# Patient Record
Sex: Female | Born: 1995 | Race: White | Hispanic: No | Marital: Married | Smoking: Former smoker
Health system: Southern US, Community
[De-identification: ages and names within clinical notes are randomized; demographics above are authoritative.]

## PROBLEM LIST (undated history)

## (undated) DIAGNOSIS — I493 Ventricular premature depolarization: Secondary | ICD-10-CM

## (undated) DIAGNOSIS — F329 Major depressive disorder, single episode, unspecified: Secondary | ICD-10-CM

## (undated) DIAGNOSIS — F32A Depression, unspecified: Secondary | ICD-10-CM

## (undated) DIAGNOSIS — F419 Anxiety disorder, unspecified: Secondary | ICD-10-CM

## (undated) DIAGNOSIS — Z3A29 29 weeks gestation of pregnancy: Secondary | ICD-10-CM

## (undated) HISTORY — DX: 29 weeks gestation of pregnancy: Z3A.29

## (undated) HISTORY — PX: WISDOM TOOTH EXTRACTION: SHX21

## (undated) HISTORY — DX: Ventricular premature depolarization: I49.3

---
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
## 2016-01-03 ENCOUNTER — Emergency Department: Payer: Managed Care, Other (non HMO)

## 2016-01-03 ENCOUNTER — Emergency Department
Admission: EM | Admit: 2016-01-03 | Discharge: 2016-01-04 | Disposition: A | Discharge status: Home / Self Care | Payer: Managed Care, Other (non HMO) | Attending: Emergency Medicine | Admitting: Emergency Medicine

## 2016-01-03 ENCOUNTER — Encounter: Payer: Self-pay | Admitting: Emergency Medicine

## 2016-01-03 DIAGNOSIS — F17200 Nicotine dependence, unspecified, uncomplicated: Secondary | ICD-10-CM | POA: Insufficient documentation

## 2016-01-03 DIAGNOSIS — E039 Hypothyroidism, unspecified: Secondary | ICD-10-CM

## 2016-01-03 DIAGNOSIS — E02 Subclinical iodine-deficiency hypothyroidism: Secondary | ICD-10-CM | POA: Diagnosis not present

## 2016-01-03 DIAGNOSIS — Z79899 Other long term (current) drug therapy: Secondary | ICD-10-CM | POA: Diagnosis not present

## 2016-01-03 DIAGNOSIS — R079 Chest pain, unspecified: Secondary | ICD-10-CM | POA: Insufficient documentation

## 2016-01-03 DIAGNOSIS — E038 Other specified hypothyroidism: Secondary | ICD-10-CM

## 2016-01-03 LAB — URINALYSIS, COMPLETE (UACMP) WITH MICROSCOPIC
BACTERIA UA: NONE SEEN
BILIRUBIN URINE: NEGATIVE
Glucose, UA: NEGATIVE mg/dL
HGB URINE DIPSTICK: NEGATIVE
KETONES UR: NEGATIVE mg/dL
LEUKOCYTES UA: NEGATIVE
NITRITE: NEGATIVE
PH: 6 (ref 5.0–8.0)
Protein, ur: NEGATIVE mg/dL
SPECIFIC GRAVITY, URINE: 1.004 — AB (ref 1.005–1.030)

## 2016-01-03 LAB — BASIC METABOLIC PANEL
ANION GAP: 9 (ref 5–15)
BUN: 10 mg/dL (ref 6–20)
CHLORIDE: 102 mmol/L (ref 101–111)
CO2: 27 mmol/L (ref 22–32)
Calcium: 9.3 mg/dL (ref 8.9–10.3)
Creatinine, Ser: 0.64 mg/dL (ref 0.44–1.00)
Glucose, Bld: 165 mg/dL — ABNORMAL HIGH (ref 65–99)
POTASSIUM: 3.3 mmol/L — AB (ref 3.5–5.1)
SODIUM: 138 mmol/L (ref 135–145)

## 2016-01-03 LAB — CBC
HEMATOCRIT: 43.2 % (ref 35.0–47.0)
Hemoglobin: 14.9 g/dL (ref 12.0–16.0)
MCH: 28.4 pg (ref 26.0–34.0)
MCHC: 34.5 g/dL (ref 32.0–36.0)
MCV: 82.3 fL (ref 80.0–100.0)
Platelets: 318 10*3/uL (ref 150–440)
RBC: 5.24 MIL/uL — AB (ref 3.80–5.20)
RDW: 13.8 % (ref 11.5–14.5)
WBC: 11 10*3/uL (ref 3.6–11.0)

## 2016-01-03 LAB — POCT PREGNANCY, URINE: PREG TEST UR: NEGATIVE

## 2016-01-03 LAB — TROPONIN I

## 2016-01-03 LAB — GLUCOSE, CAPILLARY: GLUCOSE-CAPILLARY: 156 mg/dL — AB (ref 65–99)

## 2016-01-03 NOTE — ED Notes (Signed)
Pt. States intermittent chest pain for the past week.  Pt. States weakness and dizziness are accompanied symptoms.

## 2016-01-03 NOTE — ED Triage Notes (Signed)
Pt ambulatory to triage with steady gait, no distress noted. Pt c/o dull chest pain since Thursday accompanied with lightheaded and dizziness. Pt reports she will have spells where the chest pain increases and her chest feels tight, this is when she experiences the lightheaded and dizziness. Pt is tachycardic at 126bpm at time of triage.

## 2016-01-04 ENCOUNTER — Emergency Department: Payer: Managed Care, Other (non HMO)

## 2016-01-04 ENCOUNTER — Ambulatory Visit (INDEPENDENT_AMBULATORY_CARE_PROVIDER_SITE_OTHER): Payer: Managed Care, Other (non HMO) | Admitting: Cardiology

## 2016-01-04 ENCOUNTER — Encounter: Payer: Self-pay | Admitting: Radiology

## 2016-01-04 ENCOUNTER — Encounter: Payer: Self-pay | Admitting: Cardiology

## 2016-01-04 VITALS — BP 116/64 | HR 72 | Ht 67.0 in | Wt 219.2 lb

## 2016-01-04 DIAGNOSIS — E784 Other hyperlipidemia: Secondary | ICD-10-CM | POA: Diagnosis not present

## 2016-01-04 DIAGNOSIS — R079 Chest pain, unspecified: Secondary | ICD-10-CM | POA: Diagnosis not present

## 2016-01-04 DIAGNOSIS — E7849 Other hyperlipidemia: Secondary | ICD-10-CM

## 2016-01-04 LAB — T4, FREE: FREE T4: 0.7 ng/dL (ref 0.61–1.12)

## 2016-01-04 LAB — FIBRIN DERIVATIVES D-DIMER (ARMC ONLY): Fibrin derivatives D-dimer (ARMC): 522 — ABNORMAL HIGH (ref 0–499)

## 2016-01-04 LAB — TSH: TSH: 7.141 u[IU]/mL — AB (ref 0.350–4.500)

## 2016-01-04 MED ORDER — IOPAMIDOL (ISOVUE-370) INJECTION 76%
100.0000 mL | Freq: Once | INTRAVENOUS | Status: AC | PRN
  Administered 2016-01-04: 100 mL via INTRAVENOUS

## 2016-01-04 MED ORDER — RANITIDINE HCL 150 MG PO CAPS: 150.0000 mg | ORAL_CAPSULE | Freq: Two times a day (BID) | ORAL | 0 refills | Status: DC

## 2016-01-04 NOTE — ED Provider Notes (Signed)
[facility] Emergency Department Provider Note  ____________________________________________  Time seen: Approximately 3:41 AM  I have reviewed the triage vital signs and the nursing notes.   HISTORY  Chief Complaint Chest Pain and Dizziness    HPI Genia Haroldshley N Solis is a 20 y.o. female who complains of dull chest pain for the past week, constant but waxing and waning, sometimes almost completely resolves. Sometimes associated with lightheadedness. Not exertional, not pleuritic. No shortness of breath. No cough runny nose sore throat. No vomiting diaphoresis. No syncope.  Symptoms seem to start after starting fish oil for hyperlipidemia from primary care. She feels like she's been eating and drinking normally although may be less fluids recently. Denies any change in weight.     History reviewed. No pertinent past medical history.   There are no active problems to display for this patient.    History reviewed. No pertinent surgical history.   Prior to Admission medications   Medication Sig Start Date End Date Taking? Authorizing Provider  hydrocortisone 2.5 % cream Apply 1 application topically 3 (three) times daily. For ten days 12/26/15  Yes Historical Provider, MD  Multiple Vitamin (MULTIVITAMIN WITH MINERALS) TABS tablet Take 1 tablet by mouth daily.   Yes Historical Provider, MD  ranitidine (ZANTAC) 150 MG capsule Take 1 capsule (150 mg total) by mouth 2 (two) times daily. 01/04/16   Sharman CheekPhillip Montasia Chisenhall, MD     Allergies Penicillins   History reviewed. No pertinent family history.  Social History Social History  Substance Use Topics  . Smoking status: Current Some Day Smoker  . Smokeless tobacco: Never Used  . Alcohol use No    Review of Systems  Constitutional:   No fever or chills.  ENT:   No sore throat. No rhinorrhea. Cardiovascular:   Positive as above chest pain. Respiratory:   No dyspnea or cough. Gastrointestinal:   Negative  for abdominal pain, vomiting and diarrhea.  Genitourinary:   Negative for dysuria or difficulty urinating. Musculoskeletal:   Negative for focal pain or swelling Neurological:   Negative for headaches 10-point ROS otherwise negative.  ____________________________________________   PHYSICAL EXAM:  VITAL SIGNS: ED Triage Vitals [01/03/16 2208]  Enc Vitals Group     BP (!) 157/81     Pulse Rate (!) 120     Resp 15     Temp 99.2 F (37.3 C)     Temp Source Oral     SpO2 100 %     Weight 220 lb (99.8 kg)     Height 5\' 7"  (1.702 m)     Head Circumference      Peak Flow      Pain Score      Pain Loc      Pain Edu?      Excl. in GC?     Vital signs reviewed, nursing assessments reviewed.   Constitutional:   Alert and oriented. Well appearing and in no distress. Eyes:   No scleral icterus. No conjunctival pallor. PERRL. EOMI.  No nystagmus. ENT   Head:   Normocephalic and atraumatic.   Nose:   No congestion/rhinnorhea. No septal hematoma   Mouth/Throat:   MMM, no pharyngeal erythema. No peritonsillar mass.    Neck:   No stridor. No SubQ emphysema. No meningismus. Hematological/Lymphatic/Immunilogical:   No cervical lymphadenopathy. Cardiovascular:   RRR. Symmetric bilateral radial and DP pulses.  No murmurs.  Respiratory:   Normal respiratory effort without tachypnea nor retractions. Breath sounds are clear and  equal bilaterally. No wheezes/rales/rhonchi. Gastrointestinal:   Soft and nontender. Non distended. There is no CVA tenderness.  No rebound, rigidity, or guarding. Genitourinary:   deferred Musculoskeletal:   Nontender with normal range of motion in all extremities. No joint effusions.  No lower extremity tenderness.  No edema. Neurologic:   Normal speech and language.  CN 2-10 normal. Motor grossly intact. No gross focal neurologic deficits are appreciated.  Skin:    Skin is warm, dry and intact. No rash noted.  No petechiae, purpura, or  bullae.  ____________________________________________    LABS (pertinent positives/negatives) (all labs ordered are listed, but only abnormal results are displayed) Labs Reviewed  BASIC METABOLIC PANEL - Abnormal; Notable for the following:       Result Value   Potassium 3.3 (*)    Glucose, Bld 165 (*)    All other components within normal limits  CBC - Abnormal; Notable for the following:    RBC 5.24 (*)    All other components within normal limits  URINALYSIS, COMPLETE (UACMP) WITH MICROSCOPIC - Abnormal; Notable for the following:    Color, Urine STRAW (*)    APPearance CLEAR (*)    Specific Gravity, Urine 1.004 (*)    Squamous Epithelial / LPF 0-5 (*)    All other components within normal limits  GLUCOSE, CAPILLARY - Abnormal; Notable for the following:    Glucose-Capillary 156 (*)    All other components within normal limits  FIBRIN DERIVATIVES D-DIMER ([facility] ONLY) - Abnormal; Notable for the following:    Fibrin derivatives D-dimer ([facility]) 522 (*)    All other components within normal limits  TSH - Abnormal; Notable for the following:    TSH 7.141 (*)    All other components within normal limits  TROPONIN I  T4, FREE  CBG MONITORING, ED  POC URINE PREG, ED  POCT PREGNANCY, URINE   ____________________________________________   EKG  Interpreted by me Sinus tachycardia rate 117, normal axis intervals QRS ST segments and T waves  ____________________________________________    RADIOLOGY  Chest x-ray unremarkable CT angiogram chest unremarkable  ____________________________________________   PROCEDURES Procedures  ____________________________________________   INITIAL IMPRESSION / ASSESSMENT AND PLAN / ED COURSE  Pertinent labs & imaging results that were available during my care of the patient were reviewed by me and considered in my medical decision making (see chart for details).  Patient presents with nonspecific chest pain, constant for a week.  Initial labs and EKG did not reveal any significant findings. Chest x-ray also was unremarkable. I added on a d-dimer and thyroid function tests. D-dimer came back slightly elevated, TFTs consistent with subclinical hypothyroidism, but definitely not hyperthyroid as would explain his clinical syndrome. Because of the elevated d-dimer a CT angiogram was performed, which was unremarkable. Patient will follow up with her primary care doctor tomorrow for continued monitoring of her symptoms.Considering the patient's symptoms, medical history, and physical examination today, I have low suspicion for ACS, PE, TAD, pneumothorax, carditis, mediastinitis, pneumonia, CHF, or sepsis.  Heart rate normalized after drinking fluids in the ED. Likely mild dehydration. Advised H2 blocker in the setting of fish oil and a reported history of acid reflux.     Clinical Course as of Jan 04 340  Thu Jan 04, 2016  0111 TFTs = subclinical hypothyroidism.  F/u PCP on this.  D-dimer elevated. Check CTA Chest.   [PS]    Clinical Course User Index [PS] Sharman CheekPhillip Winni Ehrhard, MD   ____________________________________________   FINAL CLINICAL  IMPRESSION(S) / ED DIAGNOSES  Final diagnoses:  Nonspecific chest pain  Subclinical hypothyroidism       Portions of this note were generated with dragon dictation software. Dictation errors may occur despite best attempts at proofreading.    Sharman Cheek, MD 01/04/16 [id number]

## 2016-01-04 NOTE — Progress Notes (Signed)
Cardiology Office Note   Date:  01/04/2016   ID:  Vanessa Thornton, DOB 09-01-1995, MRN [id number]  Referring Doctor:  Leotis ShamesSingh,Jasmine, MD   Cardiologist:   Almond LintAileen Breeanna Galgano, MD   Reason for consultation:  Chief Complaint  Patient presents with  . Other    Chest pain and sob. Meds reviewed verbally with pt.      History of Present Illness: Vanessa Thornton is a 20 y.o. female who presents for Chest pain  Symptoms started probably a week ago. She describes this as a sharp sometimes throbbing ache in the center of the chest, 4-5 out of 10 severity. Nonradiating. Less than a minute or so. Spontaneously resolving. She is not sure whether taking fish oil brought on this chest pain. Official was prescribed for high triglyceride levels. She stopped taking the fish oil immediately. However, she continues to have chest pain on and off. Not completely exertional. She doesn't bring up issues with feeling faint and presyncopal.  Patient denies PND, orthopnea, edema. No true syncope.   ROS:  Please see the history of present illness. Aside from mentioned under HPI, all other systems are reviewed and negative.     Past Medical History:  Diagnosis Date  . PVC (premature ventricular contraction)     History reviewed. No pertinent surgical history.   reports that she has been smoking.  She has smoked for the past 2.00 years. She uses smokeless tobacco. She reports that she does not drink alcohol or use drugs.   family history includes Heart attack in her paternal grandfather.   Outpatient Medications Prior to Visit  Medication Sig Dispense Refill  . hydrocortisone 2.5 % cream Apply 1 application topically 3 (three) times daily. For ten days    . Multiple Vitamin (MULTIVITAMIN WITH MINERALS) TABS tablet Take 1 tablet by mouth daily.    . ranitidine (ZANTAC) 150 MG capsule Take 1 capsule (150 mg total) by mouth 2 (two) times daily. 28 capsule 0   No facility-administered medications prior to  visit.      Allergies: Doxycycline and Penicillins    PHYSICAL EXAM: VS:  BP 116/64 (BP Location: Right Arm, Patient Position: Sitting, Cuff Size: Normal)   Pulse 72   Ht 5\' 7"  (1.702 m)   Wt 219 lb 4 oz (99.5 kg)   LMP 12/04/2015   BMI 34.34 kg/m  , Body mass index is 34.34 kg/m. Wt Readings from Last 3 Encounters:  01/04/16 219 lb 4 oz (99.5 kg)  01/03/16 220 lb (99.8 kg)  12/31/10 170 lb (77.1 kg) (95 %, Z= 1.66)*   * Growth percentiles are based on CDC 2-20 Years data.    GENERAL:  well developed, well nourished, not in acute distress HEENT: normocephalic, pink conjunctivae, anicteric sclerae, no xanthelasma, normal dentition, oropharynx clear NECK:  no neck vein engorgement, JVP normal, no hepatojugular reflux, carotid upstroke brisk and symmetric, no bruit, no thyromegaly, no lymphadenopathy LUNGS:  good respiratory effort, clear to auscultation bilaterally CV:  PMI not displaced, no thrills, no lifts, S1 and S2 within normal limits, no palpable S3 or S4, no murmurs, no rubs, no gallops ABD:  Soft, nontender, nondistended, normoactive bowel sounds, no abdominal aortic bruit, no hepatomegaly, no splenomegaly MS: nontender back, no kyphosis, no scoliosis, no joint deformities EXT:  2+ DP/PT pulses, no edema, no varicosities, no cyanosis, no clubbing SKIN: warm, nondiaphoretic, normal turgor, no ulcers NEUROPSYCH: alert, oriented to person, place, and time, sensory/motor grossly intact, normal mood, appropriate affect  Recent Labs: 01/03/2016: BUN 10; Creatinine, Ser 0.64; Hemoglobin 14.9; Platelets 318; Potassium 3.3; Sodium 138; TSH 7.141   Lipid Panel No results found for: CHOL, TRIG, HDL, CHOLHDL, VLDL, LDLCALC, LDLDIRECT   Other studies Reviewed:  EKG:  The ekg from 01/04/2016 at 1146 in the ER was personally reviewed by me and it revealed sinus tachycardia 117 BPM.  Additional studies/ records that were reviewed personally reviewed by me today include: None  available   ASSESSMENT AND PLAN:  Atypical chest pain Plan stress echocardiogram for patient reassurance. She is very concerned as pain has lingered on despite stopping the fish oil. Recommend trial with antacid. She is given prescription by ER.  Hypertriglyceridemia Obesity Managed by PCP Advocated for aggressive lifestyle change. If no ischemia on stress echo, encourage physical activity to achieve weight loss. Discussed importance of diet low in carbohydrates, low-fat, heart healthy diet.  Tobacco use/vaping We discussed the importance of smoking cessation and different strategies for quitting.    Current medicines are reviewed at length with the patient today.  The patient does not have concerns regarding medicines.  Labs/ tests ordered today include:  Orders Placed This Encounter  Procedures  . EKG 12-Lead  . ECHOCARDIOGRAM STRESS TEST    I had a lengthy and detailed discussion with the patient regarding diagnoses, prognosis, diagnostic options, treatment options , and side effects of medications.   I counseled the patient on importance of lifestyle modification including heart healthy diet, regular physical activity , and smoking cessation.   Disposition:   FU with undersigned after tests   Signed, Almond LintAileen Shalena Ezzell, MD  01/04/2016 4:58 PM    [facility]  This note was generated in part with voice recognition software and I apologize for any typographical errors that were not detected and corrected.

## 2016-01-04 NOTE — ED Notes (Signed)
Pt. Going home with family. 

## 2016-01-04 NOTE — Patient Instructions (Addendum)
Testing/Procedures: Your physician has requested that you have a stress echocardiogram. For further information please visit [email or web address]. Please follow instruction sheet as given.   Do not drink or eat foods with caffeine for 24 hours before the test. (Chocolate, coffee, tea, or energy drinks)  If you use an inhaler, bring it with you to the test.  Do not smoke for 4 hours before the test.  Wear comfortable shoes and clothing.  Follow-Up: Your physician recommends that you schedule a follow-up appointment as needed with Dr. Ingal. We will call you with results and if needed schedule follow up at that time.   It was a pleasure seeing you today here in the office. Please do not hesitate to give us a call back if you have any further questions. [phone]  Eri Mcevers A. RN, BSN      Exercise Stress Echocardiogram An exercise stress echocardiogram is a test that checks how well your heart is working. For this test, you will walk on a treadmill to make your heart beat faster. This test uses sound waves (ultrasound) and a computer to make pictures (images) of your heart. These pictures will be taken before you exercise and after you exercise. What happens before the procedure?  Follow instructions from your doctor about what you cannot eat or drink before the test.  Do not drink or eat anything that has caffeine in it. Stop having caffeine for 24 hours before the test.  Ask your doctor about changing or stopping your normal medicines. This is important if you take diabetes medicines or blood thinners. Ask your doctor if you should take your medicines with water before the test.  If you use an inhaler, bring it to the test.  Do not use any products that have nicotine or tobacco in them, such as cigarettes and e-cigarettes. Stop using them for 4 hours before the test. If you need help quitting, ask your doctor.  Wear comfortable shoes and clothing. What happens during the  procedure?  You will be hooked up to a TV screen. Your doctor will watch the screen to see how fast your heart beats during the test.  Before you exercise, a computer will make a picture of your heart. To do this:  A gel will be put on your chest.  A wand will be moved over the gel.  Sound waves from the wand will go to the computer to make the picture.  Your will start walking on a treadmill. The treadmill will start at a slow speed. It will get faster a little bit at a time. When you walk faster, your heart will beat faster.  The treadmill will be stopped when your heart is working hard.  You will lie down right away so another picture of your heart can be taken.  The test will take 30-60 minutes. What happens after the procedure?  Your heart rate and blood pressure will be watched after the test.  If your doctor says that you can, you may:  Eat what you usually eat.  Do your normal activities.  Take medicines like normal. Summary  An exercise stress echocardiogram is a test that checks how well your heart is working.  Follow instructions about what you cannot eat or drink before the test. Ask your doctor if you should take your normal medicines before the test.  Stop having caffeine for 24 hours before the test. Do not use anything with nicotine or tobacco in it for 4 hours before the test.    A computer will take a picture of your heart before you walk on a treadmill. It will take another picture when you are done walking.  Your heart rate and blood pressure will be watched after the test. This information is not intended to replace advice given to you by your health care provider. Make sure you discuss any questions you have with your health care provider. Document Released: 11/11/2008 Document Revised: 10/08/2015 Document Reviewed: 10/08/2015 Elsevier Interactive Patient Education  2017 [employer]vier Inc.  

## 2016-01-04 NOTE — Discharge Instructions (Signed)
Your CT scan of the chest today was unremarkable. Your other labs did not reveal any significant abnormalities except for an elevated TSH (thyroid stimulating hormone) level.  Please follow up with your primary care doctor for further evaluation of these symptoms.  Take Zantac twice daily in the meantime.

## 2016-01-05 ENCOUNTER — Ambulatory Visit (INDEPENDENT_AMBULATORY_CARE_PROVIDER_SITE_OTHER): Payer: Managed Care, Other (non HMO)

## 2016-01-05 ENCOUNTER — Other Ambulatory Visit: Payer: Self-pay

## 2016-01-05 DIAGNOSIS — R079 Chest pain, unspecified: Secondary | ICD-10-CM | POA: Diagnosis not present

## 2016-01-10 LAB — EXERCISE TOLERANCE TEST
CHL CUP MPHR: 200 {beats}/min
CHL CUP RESTING HR STRESS: 80 {beats}/min
CSEPED: 9 min
CSEPEDS: 0 s
Estimated workload: 10.4 METS
Peak HR: 179 {beats}/min
Percent HR: 89 %

## 2016-02-17 ENCOUNTER — Emergency Department: Payer: Managed Care, Other (non HMO)

## 2016-02-17 ENCOUNTER — Emergency Department
Admission: EM | Admit: 2016-02-17 | Discharge: 2016-02-17 | Disposition: A | Discharge status: Home / Self Care | Payer: Managed Care, Other (non HMO) | Attending: Emergency Medicine | Admitting: Emergency Medicine

## 2016-02-17 DIAGNOSIS — R0602 Shortness of breath: Secondary | ICD-10-CM | POA: Diagnosis not present

## 2016-02-17 DIAGNOSIS — F410 Panic disorder [episodic paroxysmal anxiety] without agoraphobia: Secondary | ICD-10-CM | POA: Diagnosis not present

## 2016-02-17 DIAGNOSIS — F17200 Nicotine dependence, unspecified, uncomplicated: Secondary | ICD-10-CM | POA: Diagnosis not present

## 2016-02-17 DIAGNOSIS — R0789 Other chest pain: Secondary | ICD-10-CM

## 2016-02-17 HISTORY — DX: Anxiety disorder, unspecified: F41.9

## 2016-02-17 HISTORY — DX: Depression, unspecified: F32.A

## 2016-02-17 HISTORY — DX: Major depressive disorder, single episode, unspecified: F32.9

## 2016-02-17 LAB — URINALYSIS, COMPLETE (UACMP) WITH MICROSCOPIC
BACTERIA UA: NONE SEEN
BILIRUBIN URINE: NEGATIVE
Glucose, UA: NEGATIVE mg/dL
Hgb urine dipstick: NEGATIVE
KETONES UR: NEGATIVE mg/dL
LEUKOCYTES UA: NEGATIVE
NITRITE: NEGATIVE
PROTEIN: NEGATIVE mg/dL
Specific Gravity, Urine: 1.001 — ABNORMAL LOW (ref 1.005–1.030)
Squamous Epithelial / LPF: NONE SEEN
WBC UA: NONE SEEN WBC/hpf (ref 0–5)
pH: 6 (ref 5.0–8.0)

## 2016-02-17 LAB — BASIC METABOLIC PANEL
Anion gap: 6 (ref 5–15)
BUN: 15 mg/dL (ref 6–20)
CHLORIDE: 106 mmol/L (ref 101–111)
CO2: 26 mmol/L (ref 22–32)
Calcium: 9.1 mg/dL (ref 8.9–10.3)
Creatinine, Ser: 0.7 mg/dL (ref 0.44–1.00)
GFR calc Af Amer: >60 mL/min (ref >60)
GFR calc non Af Amer: >60 mL/min (ref >60)
Glucose, Bld: 99 mg/dL (ref 65–99)
POTASSIUM: 3.6 mmol/L (ref 3.5–5.1)
SODIUM: 138 mmol/L (ref 135–145)

## 2016-02-17 LAB — CBC
HEMATOCRIT: 41.2 % (ref 35.0–47.0)
Hemoglobin: 13.9 g/dL (ref 12.0–16.0)
MCH: 27.9 pg (ref 26.0–34.0)
MCHC: 33.6 g/dL (ref 32.0–36.0)
MCV: 83 fL (ref 80.0–100.0)
Platelets: 304 10*3/uL (ref 150–440)
RBC: 4.97 MIL/uL (ref 3.80–5.20)
RDW: 13.7 % (ref 11.5–14.5)
WBC: 8.6 10*3/uL (ref 3.6–11.0)

## 2016-02-17 LAB — POCT PREGNANCY, URINE: PREG TEST UR: NEGATIVE

## 2016-02-17 LAB — TROPONIN I: Troponin I: <0.03 ng/mL (ref <0.03)

## 2016-02-17 NOTE — ED Notes (Signed)
NAD noted at time of D/C. Pt denies questions or concerns. Pt ambulatory to the lobby at this time.  

## 2016-02-17 NOTE — Discharge Instructions (Signed)
You have been seen in the Emergency Department (ED) today for chest pain and a variety of other symptoms.  As we have discussed today?s test results are normal, and we feel it is likely that panic attacks may be causing your symptoms.  Please follow up with the recommended doctor as instructed above in these documents regarding today?s emergent visit and your recent symptoms to discuss further management.  Continue to take your regular medications.   Return to the Emergency Department (ED) if you experience any further chest pain/pressure/tightness, difficulty breathing, or sudden sweating, or other symptoms that concern you.  Additionally, return to the Emergency Department if you develop any thoughts of hurting yourself or anyone else.

## 2016-02-17 NOTE — ED Provider Notes (Signed)
[facility] Emergency Department Provider Note  ____________________________________________   First MD Initiated Contact with Patient 02/17/16 [phone]     (approximate)  I have reviewed the triage vital signs and the nursing notes.   HISTORY  Chief Complaint Chest Pain and Shortness of Breath    HPI Vanessa Thornton is a 21 y.o. female with history of anxiety and depression who presents for evaluation of gradual onset of chest pain and shortness of breath that started after dinner tonight.  She reports that she was seen for similar issues in December and had a reassuring workup in the emergency department.  She followed up with her primary care doctor, Dr. Thedore Mins, who started her on Paxil and gave her when necessary Xanax as well as getting her a cardiac monitor.  This was just within the last week.  She has not been able to follow-up with the cardiac monitor due to the recent winter weather.  Over the course of this last week since starting the Paxil she has felt somewhat "detached" and occasionally like she was going to pass out although she has never had any syncopal or near syncopal episodes.  She is very frustrated by these symptoms.  She states that nothing is helping including Xanax.  Her chest pain and shortness of breath started tonight as she was worrying about her symptoms including the new symptoms of feeling like she was going to pass out.  They have completely resolved at this time although she continues to have a mild headache and random pains throughout her body but nothing specific.  She describes the episodes as severe, somewhat gradual in onset, and resolved at this time.  She has had no suicidal ideation or homicidal ideation.   Past Medical History:  Diagnosis Date  . Anxiety   . Depression   . PVC (premature ventricular contraction)     There are no active problems to display for this patient.   History reviewed. No pertinent surgical  history.  Prior to Admission medications   Medication Sig Start Date End Date Taking? Authorizing Provider  ALPRAZolam Prudy Feeler) 0.25 MG tablet Take 1 tablet by mouth 2 (two) times daily as needed. 02/12/16  Yes Historical Provider, MD  clarithromycin (BIAXIN) 500 MG tablet Take 1 tablet by mouth 2 (two) times daily. 02/12/16  Yes Historical Provider, MD  Multiple Vitamin (MULTIVITAMIN WITH MINERALS) TABS tablet Take 1 tablet by mouth daily.   Yes Historical Provider, MD  PARoxetine (PAXIL) 20 MG tablet Take 1 tablet by mouth daily. 02/12/16  Yes Historical Provider, MD  ranitidine (ZANTAC) 150 MG capsule Take 1 capsule (150 mg total) by mouth 2 (two) times daily. 01/04/16  Yes Sharman Cheek, MD    Allergies Doxycycline and Penicillins  Family History  Problem Relation Age of Onset  . Heart attack Paternal Grandfather     Social History Social History  Substance Use Topics  . Smoking status: Current Every Day Smoker    Years: 2.00  . Smokeless tobacco: Current User  . Alcohol use No     Comment: occassionally    Review of Systems Constitutional: No fever/chills.  Does feels like she is going to pass out Eyes: No visual changes. ENT: No sore throat. Cardiovascular: +chest pain. Respiratory: Denies shortness of breath. Gastrointestinal: No abdominal pain.  No nausea, no vomiting.  No diarrhea.  No constipation. Genitourinary: Negative for dysuria. Musculoskeletal: Negative for back pain. Skin: Negative for rash. Neurological: Negative for headaches, focal weakness or  numbness. Psychiatric:No suicidal or homicidal ideation.  Episodes of anxiety. 10-point ROS otherwise negative.  ____________________________________________   PHYSICAL EXAM:  VITAL SIGNS: ED Triage Vitals  Enc Vitals Group     BP 02/17/16 0441 135/66     Pulse Rate 02/17/16 0441 81     Resp 02/17/16 0441 18     Temp 02/17/16 0441 98.2 F (36.8 C)     Temp Source 02/17/16 0441 Oral     SpO2 02/17/16  0441 99 %     Weight 02/17/16 0438 220 lb (99.8 kg)     Height 02/17/16 0438 5\' 7"  (1.702 m)     Head Circumference --      Peak Flow --      Pain Score 02/17/16 0438 5     Pain Loc --      Pain Edu? --      Excl. in GC? --     Constitutional: Alert and oriented. Well appearing and in no acute distress. Eyes: Conjunctivae are normal. PERRL. EOMI. Head: Atraumatic. Nose: No congestion/rhinnorhea. Mouth/Throat: Mucous membranes are moist.  Oropharynx non-erythematous. Neck: No stridor.  No meningeal signs.   Cardiovascular: Normal rate, regular rhythm. Good peripheral circulation. Grossly normal heart sounds. Respiratory: Normal respiratory effort.  No retractions. Lungs CTAB. Gastrointestinal: Soft and nontender. No distention.  Musculoskeletal: No lower extremity tenderness nor edema. No gross deformities of extremities. Neurologic:  Normal speech and language. No gross focal neurologic deficits are appreciated. No gait instability or evidence of cerebellar dysfunction. Skin:  Skin is warm, dry and intact. No rash noted. Psychiatric: Mood and affect are normal. Speech and behavior are normal.  Denies SI/HI  ____________________________________________   LABS (all labs ordered are listed, but only abnormal results are displayed)  Labs Reviewed  URINALYSIS, COMPLETE (UACMP) WITH MICROSCOPIC - Abnormal; Notable for the following:       Result Value   Color, Urine COLORLESS (*)    APPearance CLEAR (*)    Specific Gravity, Urine 1.001 (*)    All other components within normal limits  BASIC METABOLIC PANEL  CBC  TROPONIN I  POC URINE PREG, ED  POCT PREGNANCY, URINE   ____________________________________________  EKG  ED ECG REPORT I, Jshaun Abernathy, the attending physician, personally viewed and interpreted this ECG.  Date: 02/17/2016 EKG Time: 04:48 Rate: 70 Rhythm: normal sinus rhythm QRS Axis: normal Intervals: normal ST/T Wave abnormalities: normal Conduction  Disturbances: none Narrative Interpretation: unremarkable  ____________________________________________  RADIOLOGY   Dg Chest 2 View  Result Date: 02/17/2016 CLINICAL DATA:  Central and right-sided chest pain. Shortness of breath. EXAM: CHEST  2 VIEW COMPARISON:  Radiographs and CT January 04, 2016 FINDINGS: The cardiomediastinal contours are normal. The lungs are clear. Pulmonary vasculature is normal. No consolidation, pleural effusion, or pneumothorax. No acute osseous abnormalities are seen. IMPRESSION: No active cardiopulmonary disease.  No change from prior exam. Electronically Signed   By: Rubye Oaks M.D.   On: 02/17/2016 05:43    ____________________________________________   PROCEDURES  Procedure(s) performed:   Procedures   Critical Care performed: No ____________________________________________   INITIAL IMPRESSION / ASSESSMENT AND PLAN / ED COURSE  Pertinent labs & imaging results that were available during my care of the patient were reviewed by me and considered in my medical decision making (see chart for details).  I had an extensive conversation with the patient and her mother after reviewing her workup today as well as the extensive workup from last month which also included a  CT angiography of the chest to rule out PE.  We discussed the role that psychiatric illness such as anxiety and panic attacks may play in her symptoms.  We also discussed how she may be experiencing some side effects from the Paxil.  I encouraged her to continue on her course of medicdation until she follows up with her primary care doctor because sometimes medications like Paxil require a "ramping up period" to become therapeutic.  I will provide the name and number of [facility] as well for an additional resource.  I ended up spending about 20-30 minutes in the room with the patient and her family on two separate occasions discussing her symptoms.  She continues to be very concerned that she  has "something else" going on that is causing her symptoms, "like may be something in my head".  I tried to explain multiple times that based on the history, physical exam, and symptoms, there is no additional workup that I can offer in the emergency department that we will provide any additional reassurance.  There is no indication for a head CT or other advanced imaging.  I encouraged her to follow up with her primary care provider and discuss her current management plan.  She and her family understand and agree with the plan.     ____________________________________________  FINAL CLINICAL IMPRESSION(S) / ED DIAGNOSES  Final diagnoses:  Atypical chest pain  Panic attacks     MEDICATIONS GIVEN DURING THIS VISIT:  Medications - No data to display   NEW OUTPATIENT MEDICATIONS STARTED DURING THIS VISIT:  New Prescriptions   No medications on file    Modified Medications   No medications on file    Discontinued Medications   HYDROCORTISONE 2.5 % CREAM    Apply 1 application topically 3 (three) times daily. For ten days     Note:  This document was prepared using Dragon voice recognition software and may include unintentional dictation errors.    Loleta Roseory Evella Kasal, MD 02/17/16 [phone]

## 2016-02-17 NOTE — ED Notes (Signed)
This tech was advise by the first nurse Butch, RN  to performed an EKG at 04:46am

## 2016-02-17 NOTE — ED Triage Notes (Signed)
Pt presents to ED with c/o central/right sided CP and SHOB that started after midnight tonight. Pt reports h/x of same since November. PCP seen last Monday and prescribed Paxil and Xanax, reports taking as prescribed. Pt reports having a heart monitor placed but unable to return it "due to the snow". Pt is very calm, with respirations even, regular, and unlabored, in NAD at this time.

## 2017-03-14 IMAGING — CR DG CHEST 2V
1 series · 2 of 2 positions shown · non-contrast
Comparison: Radiographs and CT January 04, 2016

CLINICAL DATA: Central and right-sided chest pain. Shortness of
breath.

EXAM:
CHEST  2 VIEW

[Series 1: dg chest 2 view · 0.14mm/px · 2 of 2 slices shown]
[im 1/2]
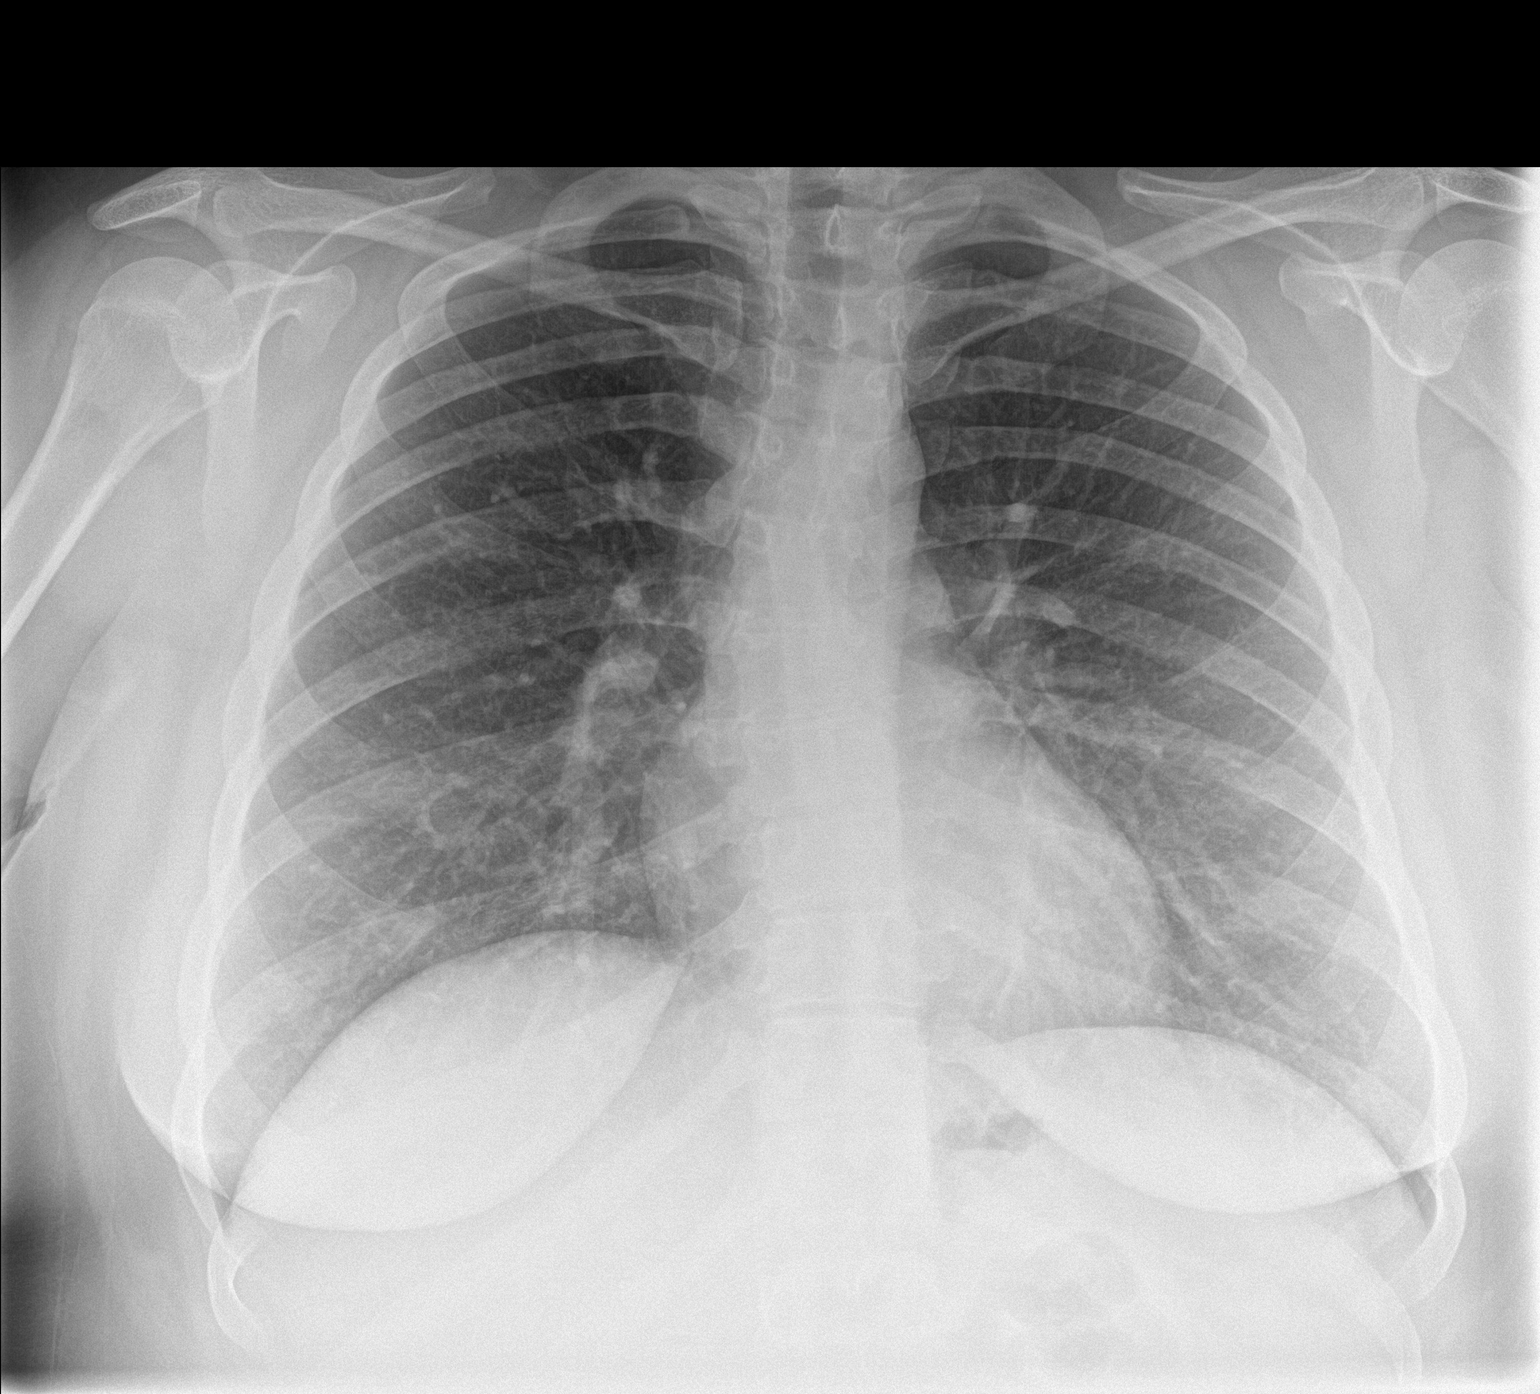
[im 2/2]
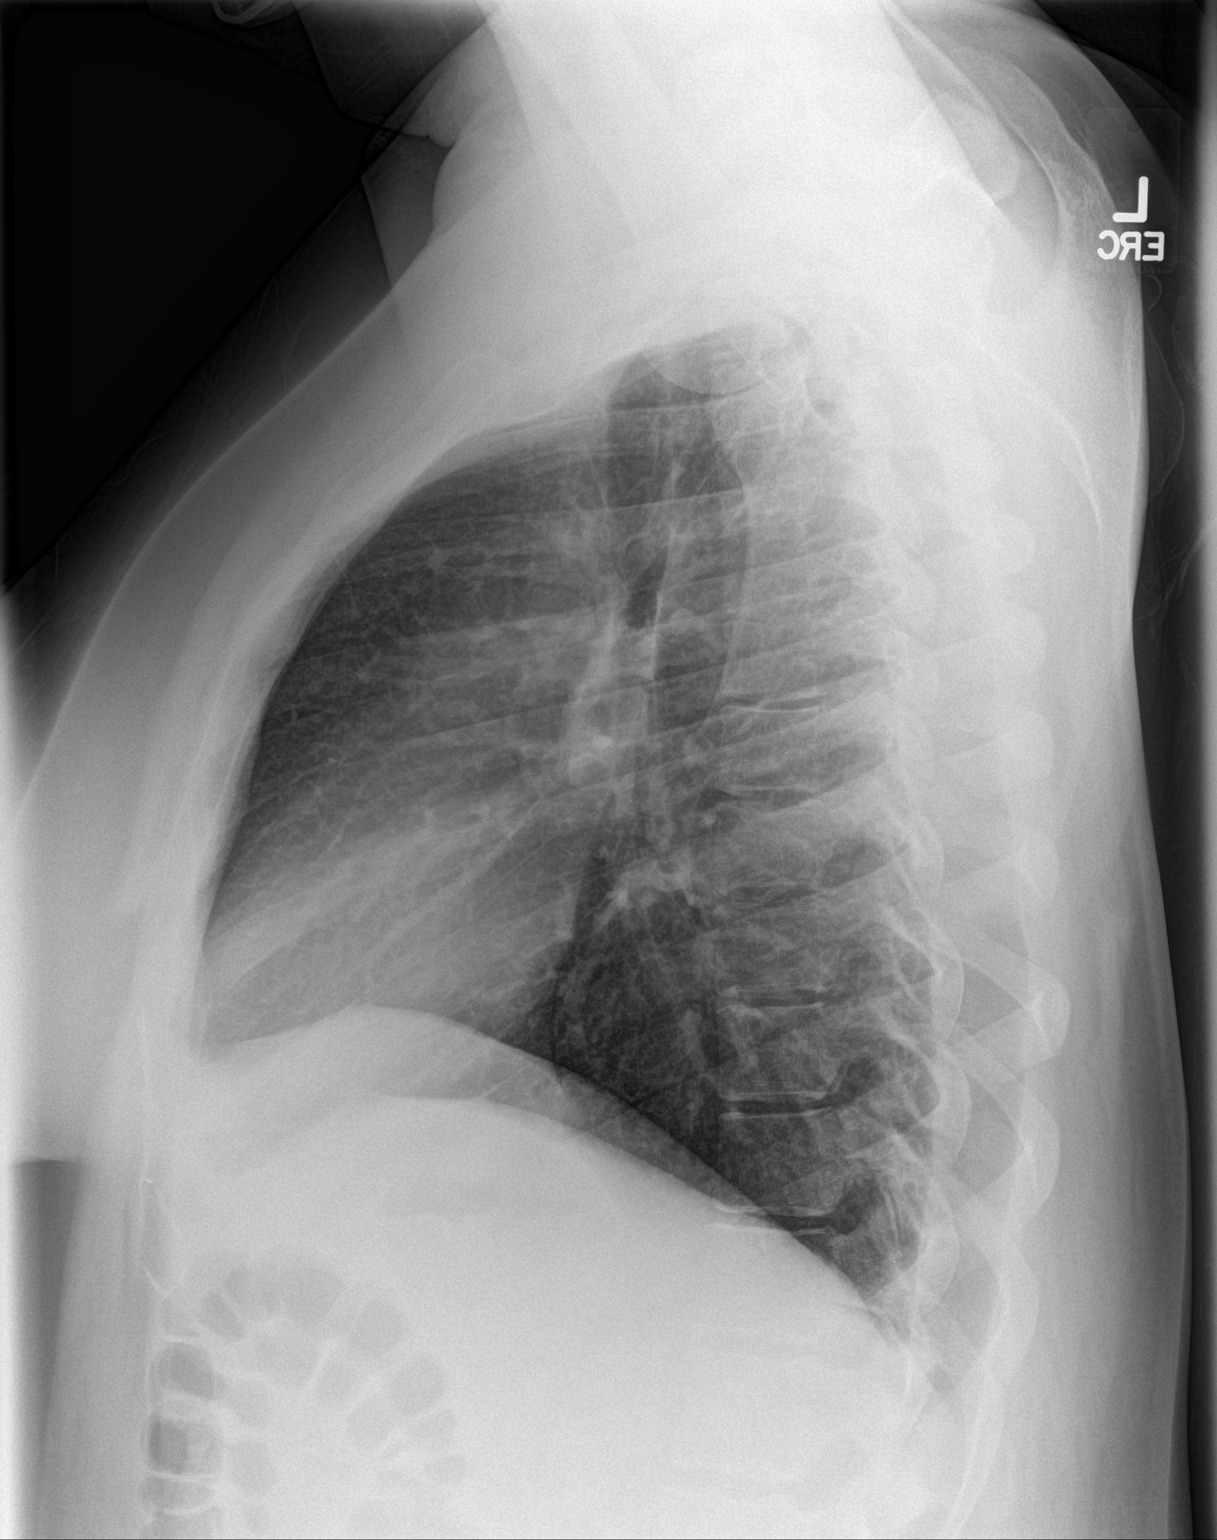

[2 of 2 positions shown; findings below may reference images not displayed]

FINDINGS: The cardiomediastinal contours are normal. The lungs are clear.
Pulmonary vasculature is normal. No consolidation, pleural effusion,
or pneumothorax. No acute osseous abnormalities are seen.
IMPRESSION: No active cardiopulmonary disease.  No change from prior exam.

## 2019-05-17 NOTE — Progress Notes (Addendum)
PCP:  Leotis Shames, MD   Chief Complaint  Patient presents with  . Gynecologic Exam  . Contraception    interested in Mirena     HPI:      [name] is a 24 y.o. No obstetric history on file. who LMP was Patient's last menstrual period was 05/17/2019 (exact date)., presents today for her NP annual examination.  Her menses are regular every 28-30 days, lasting 5-6 days.  Dysmenorrhea mild, occurring first 1-2 days of flow. She does not have intermenstrual bleeding.  Sex activity: single partner, contraception - condoms. Would like IUD. Did OCPs in past without side effects. Last Pap: 11/20/16  Results were: no abnormalities  Hx of STDs: none  There is no FH of breast cancer. There is a FH of ovarian cancer in her mat aunt, genetic testing not done. The patient does do self-breast exams.  Tobacco use: vapes daily Alcohol use: social drinker No drug use.  Exercise: moderately active  She does get adequate calcium but not Vitamin D in her diet.   Past Medical History:  Diagnosis Date  . Anxiety   . Depression   . PVC (premature ventricular contraction)     History reviewed. No pertinent surgical history.  Family History  Problem Relation Age of Onset  . Heart attack Paternal Grandfather   . Ovarian cancer Maternal Aunt        67s    Social History   Socioeconomic History  . Marital status: Single    Spouse name: Not on file  . Number of children: Not on file  . Years of education: Not on file  . Highest education level: Not on file  Occupational History  . Not on file  Tobacco Use  . Smoking status: Current Every Day Smoker    Years: 2.00  . Smokeless tobacco: Current User  Substance and Sexual Activity  . Alcohol use: No    Comment: occassionally  . Drug use: No  . Sexual activity: Yes    Birth control/protection: None  Other Topics Concern  . Not on file  Social History Narrative  . Not on file   Social Determinants of Health    Financial Resource Strain:   . Difficulty of Paying Living Expenses:   Food Insecurity:   . Worried About Programme researcher, broadcasting/film/video in the Last Year:   . Barista in the Last Year:   Transportation Needs:   . Freight forwarder (Medical):   Marland Kitchen Lack of Transportation (Non-Medical):   Physical Activity:   . Days of Exercise per Week:   . Minutes of Exercise per Session:   Stress:   . Feeling of Stress :   Social Connections:   . Frequency of Communication with Friends and Family:   . Frequency of Social Gatherings with Friends and Family:   . Attends Religious Services:   . Active Member of Clubs or Organizations:   . Attends Banker Meetings:   Marland Kitchen Marital Status:   Intimate Partner Violence:   . Fear of Current or Ex-Partner:   . Emotionally Abused:   Marland Kitchen Physically Abused:   . Sexually Abused:      Current Outpatient Medications:  .  ALPRAZolam (XANAX) 0.25 MG tablet, Take 1 tablet by mouth 2 (two) times daily as needed., Disp: , Rfl: 0 .  citalopram (CELEXA) 20 MG tablet, Take by mouth., Disp: , Rfl:  .  fluticasone (FLONASE) 50 MCG/ACT nasal spray,  SPRAY 2 SPRAYS INTO EACH NOSTRIL EVERY DAY, Disp: , Rfl:  .  levonorgestrel (KYLEENA) 19.5 MG IUD, 1 Intra Uterine Device (1 each total) by Intrauterine route once for 1 dose., Disp: 1 Intra Uterine Device, Rfl: 0 .  ondansetron (ZOFRAN ODT) 4 MG disintegrating tablet, Take 1 tablet (4 mg total) by mouth every 6 (six) hours as needed for nausea., Disp: 20 tablet, Rfl: 0     ROS:  Review of Systems  Constitutional: Negative for fatigue, fever and unexpected weight change.  Respiratory: Negative for cough, shortness of breath and wheezing.   Cardiovascular: Negative for chest pain, palpitations and leg swelling.  Gastrointestinal: Negative for blood in stool, constipation, diarrhea, nausea and vomiting.  Endocrine: Negative for cold intolerance, heat intolerance and polyuria.  Genitourinary: Negative for  dyspareunia, dysuria, flank pain, frequency, genital sores, hematuria, menstrual problem, pelvic pain, urgency, vaginal bleeding, vaginal discharge and vaginal pain.  Musculoskeletal: Negative for back pain, joint swelling and myalgias.  Skin: Negative for rash.  Neurological: Negative for dizziness, syncope, light-headedness, numbness and headaches.  Hematological: Negative for adenopathy.  Psychiatric/Behavioral: Negative for agitation, confusion, sleep disturbance and suicidal ideas. The patient is not nervous/anxious.   BREAST: No symptoms   Objective: BP 120/80   Ht 5\' 6"  (1.676 m)   Wt 237 lb (107.5 kg)   LMP 05/17/2019 (Exact Date)   BMI 38.25 kg/m    Physical Exam Constitutional:      Appearance: She is well-developed.  Genitourinary:     Vulva, vagina, cervix, uterus, right adnexa and left adnexa normal.     No vulval lesion or tenderness noted.     No vaginal discharge, erythema or tenderness.     No cervical polyp.     Uterus is not enlarged or tender.     No right or left adnexal mass present.     Right adnexa not tender.     Left adnexa not tender.  Neck:     Thyroid: No thyromegaly.  Cardiovascular:     Rate and Rhythm: Normal rate and regular rhythm.     Heart sounds: Normal heart sounds. No murmur.  Pulmonary:     Effort: Pulmonary effort is normal.     Breath sounds: Normal breath sounds.  Chest:     Breasts:        Right: No mass, nipple discharge, skin change or tenderness.        Left: No mass, nipple discharge, skin change or tenderness.  Abdominal:     Palpations: Abdomen is soft.     Tenderness: There is no abdominal tenderness. There is no guarding.  Musculoskeletal:        General: Normal range of motion.     Cervical back: Normal range of motion.  Neurological:     General: No focal deficit present.     Mental Status: She is alert and oriented to person, place, and time.     Cranial Nerves: No cranial nerve deficit.  Skin:    General:  Skin is warm and dry.  Psychiatric:        Mood and Affect: Mood normal.        Behavior: Behavior normal.        Thought Content: Thought content normal.        Judgment: Judgment normal.  Vitals reviewed.     Assessment/Plan: Encounter for annual routine gynecological examination  Cervical cancer screening - Plan: Cytology - PAP  Screening for STD (sexually transmitted disease) -  Plan: Cytology - PAP  Encounter for IUD insertion - Plan: levonorgestrel (KYLEENA) 19.5 MG IUD; IUD risks/benefits/pros/cons discussed. Pt would like IUD. Inserted today.  Family history of ovarian cancer--MyRisk testing discussed. Pt to consider and also see if mom/aunt did testing.  Meds ordered this encounter  Medications  . levonorgestrel (KYLEENA) 19.5 MG IUD    Sig: 1 Intra Uterine Device (1 each total) by Intrauterine route once for 1 dose.    Dispense:  1 Intra Uterine Device    Refill:  0    Order Specific Question:   Supervising Provider    Answer:   Nadara Mustard [id number]  . ondansetron (ZOFRAN ODT) 4 MG disintegrating tablet    Sig: Take 1 tablet (4 mg total) by mouth every 6 (six) hours as needed for nausea.    Dispense:  20 tablet    Refill:  0    Order Specific Question:   Supervising Provider    Answer:   Nadara Mustard [[id number]]             GYN counsel family planning choices, adequate intake of calcium and vitamin D, diet and exercise     F/U  Return if symptoms worsen or fail to improve.  Logan Vegh B. Marka Treloar, PA-C 05/18/2019 11:56 AM    ADDENDUM: 05/18/19 PT CALLED SEVERAL TIMES AFTER IUD PLACEMENT COMPLAINING OF STOMACH PAIN, NAUSEA, FEELING HOT AND COLD, AND SHAKING. Took tylenol, tried to rest, sipped on sprite and ate crackers. No relief. Pt then decided she wanted IUD removed since she felt so bad. Has "never felt so bad before". Pt RTO for IUD removal at 11:50 AM. Pt tolerated well.  Rx zofran eRxd for nausea. Suggested sipping drinks, bland foods, NSAIDs. F/u prn.  Will call pt in a few hrs to check on her.   BP=100/80 Pt is stable but looks uncomfortable.   Pelvic exam:  Two IUD strings present seen coming from the cervical os. EGBUS, vaginal vault and cervix: within normal limits  IUD Removal Strings of IUD identified and grasped.  IUD removed without problem with ring forceps.  Pt tolerated this well.  IUD noted to be intact.  Assessment:   Nausea - Plan: ondansetron (ZOFRAN ODT) 4 MG disintegrating tablet  Encounter for IUD removal  Marquerite Forsman B. Venice Marcucci, PA-C 05/18/2019 11:56 AM

## 2019-05-18 ENCOUNTER — Ambulatory Visit (INDEPENDENT_AMBULATORY_CARE_PROVIDER_SITE_OTHER): Payer: Commercial Managed Care - PPO | Admitting: Obstetrics and Gynecology

## 2019-05-18 ENCOUNTER — Other Ambulatory Visit (HOSPITAL_COMMUNITY)
Admission: RE | Admit: 2019-05-18 | Discharge: 2019-05-18 | Disposition: A | Discharge status: Home / Self Care | Payer: Managed Care, Other (non HMO) | Source: Ambulatory Visit | Attending: Obstetrics and Gynecology | Admitting: Obstetrics and Gynecology

## 2019-05-18 ENCOUNTER — Other Ambulatory Visit: Payer: Self-pay

## 2019-05-18 ENCOUNTER — Encounter: Payer: Self-pay | Admitting: Obstetrics and Gynecology

## 2019-05-18 VITALS — BP 120/80 | Ht 66.0 in | Wt 237.0 lb

## 2019-05-18 DIAGNOSIS — Z8041 Family history of malignant neoplasm of ovary: Secondary | ICD-10-CM | POA: Insufficient documentation

## 2019-05-18 DIAGNOSIS — Z30432 Encounter for removal of intrauterine contraceptive device: Secondary | ICD-10-CM

## 2019-05-18 DIAGNOSIS — Z01419 Encounter for gynecological examination (general) (routine) without abnormal findings: Secondary | ICD-10-CM | POA: Diagnosis not present

## 2019-05-18 DIAGNOSIS — R110 Nausea: Secondary | ICD-10-CM | POA: Diagnosis not present

## 2019-05-18 DIAGNOSIS — T385X5A Adverse effect of other estrogens and progestogens, initial encounter: Secondary | ICD-10-CM

## 2019-05-18 DIAGNOSIS — Z113 Encounter for screening for infections with a predominantly sexual mode of transmission: Secondary | ICD-10-CM

## 2019-05-18 DIAGNOSIS — R1084 Generalized abdominal pain: Secondary | ICD-10-CM

## 2019-05-18 DIAGNOSIS — Z124 Encounter for screening for malignant neoplasm of cervix: Secondary | ICD-10-CM | POA: Diagnosis not present

## 2019-05-18 DIAGNOSIS — Z30430 Encounter for insertion of intrauterine contraceptive device: Secondary | ICD-10-CM | POA: Diagnosis not present

## 2019-05-18 MED ORDER — KYLEENA 19.5 MG IU IUD: 19.5000 mg | INTRAUTERINE_SYSTEM | Freq: Once | INTRAUTERINE | 0 refills | Status: DC

## 2019-05-18 MED ORDER — ONDANSETRON 4 MG PO TBDP: 4.0000 mg | ORAL_TABLET | Freq: Four times a day (QID) | ORAL | 0 refills | Status: DC | PRN

## 2019-05-18 NOTE — Addendum Note (Signed)
Addended by: Althea Grimmer B on: 05/18/2019 12:00 PM   Modules accepted: Orders

## 2019-05-18 NOTE — Patient Instructions (Signed)
I value your feedback and entrusting us with your care. If you get a [facility] patient survey, I would appreciate you taking the time to let us know about your experience today. Thank you!  As of January 07, 2019, your lab results will be released to your MyChart immediately, before I even have a chance to see them. Please give me time to review them and contact you if there are any abnormalities. Thank you for your patience.  

## 2019-05-19 ENCOUNTER — Other Ambulatory Visit: Payer: Self-pay | Admitting: Obstetrics and Gynecology

## 2019-05-19 MED ORDER — ALYACEN 1/35 1-35 MG-MCG PO TABS: 1.0000 | ORAL_TABLET | Freq: Every day | ORAL | 3 refills | Status: DC

## 2019-05-19 NOTE — Progress Notes (Signed)
Rx OCPs for BC. Couldn't tolerate IUD.

## 2019-05-21 LAB — CYTOLOGY - PAP
Chlamydia: NEGATIVE
Comment: NEGATIVE
Comment: NORMAL
Diagnosis: NEGATIVE
Neisseria Gonorrhea: NEGATIVE

## 2019-06-04 ENCOUNTER — Encounter: Payer: Self-pay | Admitting: Obstetrics and Gynecology

## 2019-06-07 ENCOUNTER — Telehealth: Payer: Self-pay

## 2019-06-07 NOTE — Telephone Encounter (Signed)
Spoke w/[facility]. They stated the rx must have been received by [facility], but not made the jump to their store. Verbal given on 05/19/19 rx w/refills.

## 2019-06-11 ENCOUNTER — Encounter: Payer: Self-pay | Admitting: Obstetrics and Gynecology

## 2019-06-15 ENCOUNTER — Ambulatory Visit: Payer: Commercial Managed Care - PPO | Admitting: Obstetrics and Gynecology

## 2019-09-03 ENCOUNTER — Encounter: Payer: Self-pay | Admitting: Obstetrics and Gynecology

## 2019-09-06 ENCOUNTER — Encounter: Payer: Self-pay | Admitting: Obstetrics and Gynecology

## 2019-10-11 ENCOUNTER — Telehealth: Payer: Self-pay

## 2019-10-11 NOTE — Telephone Encounter (Signed)
Pt calling; needs TB test; didn't know if Dr. Merlinda Frederick could do that or not.  [phone]  Left detailed msg that Dr. Merlinda Frederick is not at this practice.  If needs [place] instead to leave another msg on the nurse line.

## 2019-10-14 ENCOUNTER — Encounter: Payer: Self-pay | Admitting: Obstetrics and Gynecology

## 2019-10-18 ENCOUNTER — Encounter: Payer: Self-pay | Admitting: Obstetrics and Gynecology

## 2020-01-07 ENCOUNTER — Encounter: Payer: Self-pay | Admitting: Obstetrics and Gynecology

## 2020-01-07 ENCOUNTER — Other Ambulatory Visit (HOSPITAL_COMMUNITY)
Admission: RE | Admit: 2020-01-07 | Discharge: 2020-01-07 | Disposition: A | Discharge status: Home / Self Care | Payer: Commercial Managed Care - PPO | Source: Ambulatory Visit | Attending: Obstetrics and Gynecology | Admitting: Obstetrics and Gynecology

## 2020-01-07 ENCOUNTER — Ambulatory Visit (INDEPENDENT_AMBULATORY_CARE_PROVIDER_SITE_OTHER): Payer: Commercial Managed Care - PPO | Admitting: Obstetrics and Gynecology

## 2020-01-07 ENCOUNTER — Other Ambulatory Visit: Payer: Self-pay

## 2020-01-07 VITALS — BP 100/60 | Wt 194.0 lb

## 2020-01-07 DIAGNOSIS — Z3401 Encounter for supervision of normal first pregnancy, first trimester: Secondary | ICD-10-CM

## 2020-01-07 DIAGNOSIS — Z113 Encounter for screening for infections with a predominantly sexual mode of transmission: Secondary | ICD-10-CM

## 2020-01-07 DIAGNOSIS — N912 Amenorrhea, unspecified: Secondary | ICD-10-CM

## 2020-01-07 DIAGNOSIS — Z7185 Encounter for immunization safety counseling: Secondary | ICD-10-CM

## 2020-01-07 DIAGNOSIS — O99211 Obesity complicating pregnancy, first trimester: Secondary | ICD-10-CM

## 2020-01-07 DIAGNOSIS — Z6830 Body mass index (BMI) 30.0-30.9, adult: Secondary | ICD-10-CM

## 2020-01-07 DIAGNOSIS — Z3A01 Less than 8 weeks gestation of pregnancy: Secondary | ICD-10-CM

## 2020-01-07 DIAGNOSIS — O0990 Supervision of high risk pregnancy, unspecified, unspecified trimester: Secondary | ICD-10-CM | POA: Insufficient documentation

## 2020-01-07 DIAGNOSIS — Z369 Encounter for antenatal screening, unspecified: Secondary | ICD-10-CM

## 2020-01-07 LAB — POCT URINE PREGNANCY: Preg Test, Ur: POSITIVE — AB

## 2020-01-07 NOTE — Patient Instructions (Addendum)
First Trimester of Pregnancy The first trimester of pregnancy is from week 1 until the end of week 13 (months 1 through 3). A week after a sperm fertilizes an egg, the egg will implant on the wall of the uterus. This embryo will begin to develop into a baby. Genes from you and your partner will form the baby. The female genes will determine whether the baby will be a boy or a girl. At 6-8 weeks, the eyes and face will be formed, and the heartbeat can be seen on ultrasound. At the end of 12 weeks, all the baby's organs will be formed. Now that you are pregnant, you will want to do everything you can to have a healthy baby. Two of the most important things are to get good prenatal care and to follow your health care provider's instructions. Prenatal care is all the medical care you receive before the baby's birth. This care will help prevent, find, and treat any problems during the pregnancy and childbirth. Body changes during your first trimester Your body goes through many changes during pregnancy. The changes vary from woman to woman.  You may gain or lose a couple of pounds at first.  You may feel sick to your stomach (nauseous) and you may throw up (vomit). If the vomiting is uncontrollable, call your health care provider.  You may tire easily.  You may develop headaches that can be relieved by medicines. All medicines should be approved by your health care provider.  You may urinate more often. Painful urination may mean you have a bladder infection.  You may develop heartburn as a result of your pregnancy.  You may develop constipation because certain hormones are causing the muscles that push stool through your intestines to slow down.  You may develop hemorrhoids or swollen veins (varicose veins).  Your breasts may begin to grow larger and become tender. Your nipples may stick out more, and the tissue that surrounds them (areola) may become darker.  Your gums may bleed and may be  sensitive to brushing and flossing.  Dark spots or blotches (chloasma, mask of pregnancy) may develop on your face. This will likely fade after the baby is born.  Your menstrual periods will stop.  You may have a loss of appetite.  You may develop cravings for certain kinds of food.  You may have changes in your emotions from day to day, such as being excited to be pregnant or being concerned that something may go wrong with the pregnancy and baby.  You may have more vivid and strange dreams.  You may have changes in your hair. These can include thickening of your hair, rapid growth, and changes in texture. Some women also have hair loss during or after pregnancy, or hair that feels dry or thin. Your hair will most likely return to normal after your baby is born. What to expect at prenatal visits During a routine prenatal visit:  You will be weighed to make sure you and the baby are growing normally.  Your blood pressure will be taken.  Your abdomen will be measured to track your baby's growth.  The fetal heartbeat will be listened to between weeks 10 and 14 of your pregnancy.  Test results from any previous visits will be discussed. Your health care provider may ask you:  How you are feeling.  If you are feeling the baby move.  If you have had any abnormal symptoms, such as leaking fluid, bleeding, severe headaches, or abdominal   cramping.  If you are using any tobacco products, including cigarettes, chewing tobacco, and electronic cigarettes.  If you have any questions. Other tests that may be performed during your first trimester include:  Blood tests to find your blood type and to check for the presence of any previous infections. The tests will also be used to check for low iron levels (anemia) and protein on red blood cells (Rh antibodies). Depending on your risk factors, or if you previously had diabetes during pregnancy, you may have tests to check for high blood sugar  that affects pregnant women (gestational diabetes).  Urine tests to check for infections, diabetes, or protein in the urine.  An ultrasound to confirm the proper growth and development of the baby.  Fetal screens for spinal cord problems (spina bifida) and Down syndrome.  HIV (human immunodeficiency virus) testing. Routine prenatal testing includes screening for HIV, unless you choose not to have this test.  You may need other tests to make sure you and the baby are doing well. Follow these instructions at home: Medicines  Follow your health care provider's instructions regarding medicine use. Specific medicines may be either safe or unsafe to take during pregnancy.  Take a prenatal vitamin that contains at least 600 micrograms (mcg) of folic acid.  If you develop constipation, try taking a stool softener if your health care provider approves. Eating and drinking   Eat a balanced diet that includes fresh fruits and vegetables, whole grains, good sources of protein such as meat, eggs, or tofu, and low-fat dairy. Your health care provider will help you determine the amount of weight gain that is right for you.  Avoid raw meat and uncooked cheese. These carry germs that can cause birth defects in the baby.  Eating four or five small meals rather than three large meals a day may help relieve nausea and vomiting. If you start to feel nauseous, eating a few soda crackers can be helpful. Drinking liquids between meals, instead of during meals, also seems to help ease nausea and vomiting.  Limit foods that are high in fat and processed sugars, such as fried and sweet foods.  To prevent constipation: ? Eat foods that are high in fiber, such as fresh fruits and vegetables, whole grains, and beans. ? Drink enough fluid to keep your urine clear or pale yellow. Activity  Exercise only as directed by your health care provider. Most women can continue their usual exercise routine during  pregnancy. Try to exercise for 30 minutes at least 5 days a week. Exercising will help you: ? Control your weight. ? Stay in shape. ? Be prepared for labor and delivery.  Experiencing pain or cramping in the lower abdomen or lower back is a good sign that you should stop exercising. Check with your health care provider before continuing with normal exercises.  Try to avoid standing for long periods of time. Move your legs often if you must stand in one place for a long time.  Avoid heavy lifting.  Wear low-heeled shoes and practice good posture.  You may continue to have sex unless your health care provider tells you not to. Relieving pain and discomfort  Wear a good support bra to relieve breast tenderness.  Take warm sitz baths to soothe any pain or discomfort caused by hemorrhoids. Use hemorrhoid cream if your health care provider approves.  Rest with your legs elevated if you have leg cramps or low back pain.  If you develop varicose veins in   your legs, wear support hose. Elevate your feet for 15 minutes, 3-4 times a day. Limit salt in your diet. Prenatal care  Schedule your prenatal visits by the twelfth week of pregnancy. They are usually scheduled monthly at first, then more often in the last 2 months before delivery.  Write down your questions. Take them to your prenatal visits.  Keep all your prenatal visits as told by your health care provider. This is important. Safety  Wear your seat belt at all times when driving.  Make a list of emergency phone numbers, including numbers for family, friends, the hospital, and police and fire departments. General instructions  Ask your health care provider for a referral to a local prenatal education class. Begin classes no later than the beginning of month 6 of your pregnancy.  Ask for help if you have counseling or nutritional needs during pregnancy. Your health care provider can offer advice or refer you to specialists for help  with various needs.  Do not use hot tubs, steam rooms, or saunas.  Do not douche or use tampons or scented sanitary pads.  Do not cross your legs for long periods of time.  Avoid cat litter boxes and soil used by cats. These carry germs that can cause birth defects in the baby and possibly loss of the fetus by miscarriage or stillbirth.  Avoid all smoking, herbs, alcohol, and medicines not prescribed by your health care provider. Chemicals in these products affect the formation and growth of the baby.  Do not use any products that contain nicotine or tobacco, such as cigarettes and e-cigarettes. If you need help quitting, ask your health care provider. You may receive counseling support and other resources to help you quit.  Schedule a dentist appointment. At home, brush your teeth with a soft toothbrush and be gentle when you floss. Contact a health care provider if:  You have dizziness.  You have mild pelvic cramps, pelvic pressure, or nagging pain in the abdominal area.  You have persistent nausea, vomiting, or diarrhea.  You have a bad smelling vaginal discharge.  You have pain when you urinate.  You notice increased swelling in your face, hands, legs, or ankles.  You are exposed to fifth disease or chickenpox.  You are exposed to [place] measles (rubella) and have never had it. Get help right away if:  You have a fever.  You are leaking fluid from your vagina.  You have spotting or bleeding from your vagina.  You have severe abdominal cramping or pain.  You have rapid weight gain or loss.  You vomit blood or material that looks like coffee grounds.  You develop a severe headache.  You have shortness of breath.  You have any kind of trauma, such as from a fall or a car accident. Summary  The first trimester of pregnancy is from week 1 until the end of week 13 (months 1 through 3).  Your body goes through many changes during pregnancy. The changes vary from  woman to woman.  You will have routine prenatal visits. During those visits, your health care provider will examine you, discuss any test results you may have, and talk with you about how you are feeling. This information is not intended to replace advice given to you by your health care provider. Make sure you discuss any questions you have with your health care provider. Document Revised: 12/27/2016 Document Reviewed: 12/27/2015 Elsevier Patient Education  2020 Elsevier Inc.    OVER THE COUNTER  MEDICINES  SAFE IN PREGNANCY   COMPLAINT                                        MEDICINE                                                 Constipation Add fiber supplements such as Metamucil, Fibercon, or Citrucel.  Increasing fiber intake via bran cereals, oatmeal, leafy greens, and prunes is another alternative.  Increase you fluid intake.  You may also add Colace 100mg  by mouth twice daily or Senakot   Cough/Cold      Robitusin, Mucinex  Cuts and  Scrapes Bacitracin, Neosporin, Plysporin  Dental Pain     Tylenol. Avoid aspirin  products, which  could cause bleeding problems for mother and baby.  Also avoid ibuprofen products, such as Advil, Motrin or Aleve unless your doctor or nurse specifically recommends these drugs.  Diarrhea Immodium, Kaopectate, or Donnagel PG .  Ensure adequate hydration by taking in plenty of clear liquids such as Gatorade, ginger ale, and jello.  Call if symptoms persist over 24-hrs.  Do NOT take Pepto-Bismol  Fever Take Tylenol *, Extra-Strength Tylenol *, or any aspirin-free pain reliever (acetaminophen).  Avoid aspirin products, which could cause bleeding problems for mother and baby.  Also avoid ibuprofen products, such as Advil, Motrin or Aleve unless your doctor or nurse specifically recommends these drugs.  If you temperature is over 101o F (38.3o C) call the  clinic  Gas    Gaviscon,, Mylicon,  Riopan  Headache   Tylenol.  Avoid aspirin                                                                                products, which  could cause bleeding problems for mother and baby.  Also avoid ibuprofen products, such as Advil, Motrin or Aleve unless your doctor or nurse specifically recommends these drugs.  Head Lice     Nix  Heartburn/Indigestion TUMS, Rolaids, Zantac, Mylanta. Maalox   Avoid fatty, fried, or spicy foods.  Eat small frequent meals 5-6 times daily as opposed to 3 large meals a day.  Hemorrhoids Annusol HC, Tucks , Perperation H , or Dermaplast.  Warm sitz baths for 30 minutes twice daily.  Air dry and sleep without underwear.  Avoid constipation and see recommendation above for constipation as this may exacerbate/worsen you hemorrhoids  Leg Cramps     TUMS, Vitamin with Potassium  Leg Swelling  Elevate legs above  waist, lay on your left side.  Well fitting  shoes and support  hose  Muscle Aches                Tylenol. Avoid aspirin products, which  could cause bleeding problems for mother and baby.  Also avoid ibuprofen products, such as Advil, Motrin or Aleve unless your doctor or nurse specifically recommends these drugs.  Nausea/Vomiting Emetrol.  Supportive measures to ensure you stay adequately hydrated.  Sips of juice, Gatorade, ginger ale 4 times an hour.  If you are unable to tolerate fluid intake for greater than 8hrs call the clinic.  You may want to allow ginger ale to go flat as carbonation my precipitate emesis.  Over the counter chewable prenatal vitamins are available.  Vitamin B6 (pyridoxine) 10 to 24mg  every 8hrs and doxylamine (Unisome sleep taps) 25mg  at bedtime and 12.5mg  in the morning is first line.  Note that B6 supplements are not FDA regulated, there is a prescription of the above combination commercially available called Diclegis.  Nosebleeds Apply  ice pack to nose.  Particularly in winter time with dryer air consider the use of a humidifier   Painful urination                 Call clinic  Rash/ Itch Benadryl, Aveeno, Cortaid.  For severe whole body itching in the late second early third trimester call the office  Sleep Problems     Benadryl, Tylenol  PM, or Unisom  Seasonal Allergies    Claritin, Zyrtec,           Benadryl, Mucinex  Sinus Congestion Tylenol with Sudafed (pseudoephedrine no phenylephrine) or Actifed.  Sore Throat Chloroseptic lozenges and sprays such as Cepacol.  Salt water gargles (1 teaspoon of table salt in one quart of water)  Call if associated temperature above 101 Fahrenheit or 38.3 Celsius  Yeast Infection                Monistat

## 2020-01-07 NOTE — Progress Notes (Signed)
New Obstetric Patient H&P    Chief Complaint: Missed period and positive home pregnancy test   History of Present Illness: Patient is a 24 y.o. G1P0000 Not Hispanic or Latino female, presents with amenorrhea and positive home pregnancy test. Patient's last menstrual period was 11/28/2019. and based on her  LMP, her EDD is Estimated Date of Delivery: 09/03/20 and her EGA is [redacted]w[redacted]d. Cycles are 5. days, regular, and occur approximately every : 28 days. Her last pap smear was 05/18/19 and was no abnormalities.    She had a urine pregnancy test which was positive 1 or 2 week(s)  ago. Her last menstrual period was normal and lasted for  5 or 6 week(s). Since her LMP she claims she has experienced an increase in breast tenderness, mild cramping. She denies vaginal bleeding. Her past medical history is contibutory - patient with hx of PVCs and anxiety. This is her first pregnancy.  Since her LMP, she admits to the use of tobacco products  Yes - has quite daily use of e-cigarettes She denies weight gain since the start of her pregnancy. There are cats in the home in the home  yes If yes Indoor - Husband changing liter box She admits close contact with children on a regular basis  Previously - has ended recent employment in a daycare  She has had chicken pox in the past no She has had Tuberculosis exposures, symptoms, or previously tested positive for TB   no Current or past history of domestic violence. no  Genetic Screening/Teratology Counseling: (Includes patient, baby's father, or anyone in either family with:)   1. Patient's age >/= 23 at [facility]  no 2. Thalassemia ([place], [place], Mediterranean, or Asian background): MCV<80  no 3. Neural tube defect (meningomyelocele, spina bifida, anencephaly)  no 4. Congenital heart defect  no  5. Down syndrome  no 6. Tay-Sachs (Jewish, [place]  no 7. Canavan's Disease  no 8. Sickle cell disease or trait (African)  no  9. Hemophilia or other blood  disorders  no  10. Muscular dystrophy  no  11. Cystic fibrosis  no  12. Huntington's Chorea  no  13. Mental retardation/autism  yes 14. Other inherited genetic or chromosomal disorder  no 15. Maternal metabolic disorder (DM, PKU, etc)  no 16. Patient or FOB with a child with a birth defect not listed above no  16a. Patient or FOB with a birth defect themselves no 17. Recurrent pregnancy loss, or stillbirth  no  18. Any medications since LMP other than prenatal vitamins (include vitamins, supplements, OTC meds, drugs, alcohol)  Yes - see medication list 19. Any other genetic/environmental exposure to discuss  no  Infection History:   1. Lives with someone with TB or TB exposed  no  2. Patient or partner has history of genital herpes  no 3. Rash or viral illness since LMP  no 4. History of STI (GC, CT, HPV, syphilis, HIV)  no 5. History of recent travel :  no  Other pertinent information:  Currently unemployed. Husband Alex involved in care.     Review of Systems:10 point review of systems negative unless otherwise noted in HPI  Past Medical History:  Patient Active Problem List   Diagnosis Date Noted  . Encounter for supervision of normal first pregnancy in first trimester 01/07/2020    Clinic [facility] Prenatal [facility]  Dating  LMP - [place] ordered Blood type:     Genetic Screen  NIPS: Antibody:   Anatomic [place]  Rubella:   Varicella: @VZVIGG @  GTT Early:               Third trimester:  RPR:     Rhogam  HBsAg:     TDaP vaccine                       Flu Shot: HIV:     Baby Food                                GBS:   Contraception  Pap:  CBB     CS/VBAC    Support Person  Alex        . Obesity affecting pregnancy in first trimester 01/07/2020    Family hx of DM (mother with DM and GDM). Patient reports 40lb weight loss in last year. Offered early 1h GTT.   14/10/2019 Family history of ovarian cancer 05/18/2019    Past Surgical History:  Past Surgical History:  Procedure Laterality  Date  . NO PAST SURGERIES      Gynecologic History: Patient's last menstrual period was 11/28/2019.  Obstetric History: G1P0000  Family History:  Family History  Problem Relation Age of Onset  . Heart attack Paternal Grandfather   . Ovarian cancer Maternal Aunt        34s    Social History:  Social History   Socioeconomic History  . Marital status: Married    Spouse name: Not on file  . Number of children: Not on file  . Years of education: Not on file  . Highest education level: Not on file  Occupational History  . Not on file  Tobacco Use  . Smoking status: Former Smoker    Years: 2.00  . Smokeless tobacco: Current User  Vaping Use  . Vaping Use: Former  Substance and Sexual Activity  . Alcohol use: No    Comment: occassionally  . Drug use: No  . Sexual activity: Yes    Partners: Male    Birth control/protection: None  Other Topics Concern  . Not on file  Social History Narrative  . Not on file   Social Determinants of Health   Financial Resource Strain: Not on file  Food Insecurity: Not on file  Transportation Needs: Not on file  Physical Activity: Not on file  Stress: Not on file  Social Connections: Not on file  Intimate Partner Violence: Not on file    Allergies:  Allergies  Allergen Reactions  . Doxycycline     Other reaction(s): Other (See Comments) Bad vaginal yeast infection  . Penicillins Hives and Rash    Has patient had a PCN reaction causing immediate rash, facial/tongue/throat swelling, SOB or lightheadedness with hypotension: Yes Has patient had a PCN reaction causing severe rash involving mucus membranes or skin necrosis: No Has patient had a PCN reaction that required hospitalization No Has patient had a PCN reaction occurring within the last 10 years: Yes If all of the above answers are "NO", then may proceed with Cephalosporin use.     Medications: Prior to Admission medications   Medication Sig Start Date End Date Taking?  Authorizing Provider  citalopram (CELEXA) 20 MG tablet Take by mouth. 02/02/19  Yes [provider]  LORazepam (ATIVAN) 0.5 MG tablet  08/11/19   [provider]    Physical Exam Vitals: Blood pressure 100/60, weight 194 lb (88 kg), last menstrual period 11/28/2019.  General: NAD HEENT: normocephalic, anicteric Thyroid: no enlargement, no palpable nodules Pulmonary: No increased work of breathing, CTAB Cardiovascular: RRR, distal pulses 2+ Abdomen: NABS, soft, non-tender, non-distended.  Umbilicus without lesions.  No hepatomegaly, splenomegaly or masses palpable. No evidence of hernia  Genitourinary:  External: Normal external female genitalia.  Normal urethral meatus, normal  Bartholin's and Skene's glands.    Vagina: Normal vaginal mucosa, no evidence of prolapse.    Cervix: Grossly normal in appearance, no bleeding  Uterus: Enlarged - consistent with [redacted] weeks gestation, mobile, normal contour.  No CMT  Adnexa: ovaries non-enlarged, no adnexal masses  Rectal: deferred Extremities: no edema, erythema, or tenderness Neurologic: Grossly intact Psychiatric: mood appropriate, affect full   Assessment: 24 y.o. G1P0000 at [redacted]w[redacted]d presenting to initiate prenatal care  Plan: 1) Avoid alcoholic beverages. 2) Patient encouraged not to smoke.  3) Discontinue the use of all non-medicinal drugs and chemicals.  4) Take prenatal vitamins daily.  5) Nutrition, food safety (fish, cheese advisories, and high nitrite foods) and exercise discussed. 6) Hospital and practice style discussed with cross coverage system.  7) Genetic Screening, such as with 1st Trimester Screening, cell free fetal DNA, AFP testing, and Ultrasound, as well as with amniocentesis and CVS as appropriate, is discussed with patient. At the conclusion of today's visit patient requested genetic testing 8) Patient is asked about travel to areas at risk for the Zika virus, and counseled to avoid travel and exposure to  mosquitoes or sexual partners who may have themselves been exposed to the virus. Testing is discussed, and will be ordered as appropriate.  9) Patient with elevated BMI, recent 40lb weight loss - reviewed recommendation for weight gain in pregnancy - family hx of DM (Mother with DM and hx of GDM) - offered 1h GTT - patient accepted  -NOB labs today, RTC for ROB, dating [place], and 1h GTT in two weeks   Zipporah Plants, CNM, MSN [facility], [facility] 01/07/2020, 9:27 AM

## 2020-01-08 LAB — RPR+RH+ABO+RUB AB+AB SCR+CB...
Antibody Screen: NEGATIVE
HIV Screen 4th Generation wRfx: NONREACTIVE
Hematocrit: 43.3 % (ref 34.0–46.6)
Hemoglobin: 14.1 g/dL (ref 11.1–15.9)
Hepatitis B Surface Ag: NEGATIVE
MCH: 28.3 pg (ref 26.6–33.0)
MCHC: 32.6 g/dL (ref 31.5–35.7)
MCV: 87 fL (ref 79–97)
Platelets: 293 10*3/uL (ref 150–450)
RBC: 4.99 x10E6/uL (ref 3.77–5.28)
RDW: 13.1 % (ref 11.7–15.4)
RPR Ser Ql: NONREACTIVE
Rh Factor: POSITIVE
Rubella Antibodies, IGG: 2.25 index (ref >0.99)
Varicella zoster IgG: 212 index (ref >165)
WBC: 5.1 10*3/uL (ref 3.4–10.8)

## 2020-01-10 LAB — CERVICOVAGINAL ANCILLARY ONLY
Chlamydia: NEGATIVE
Comment: NEGATIVE
Comment: NORMAL
Neisseria Gonorrhea: NEGATIVE

## 2020-01-10 LAB — URINE CULTURE

## 2020-01-19 LAB — URINE DRUG PANEL 7
Amphetamines, Urine: NEGATIVE ng/mL
Barbiturate Quant, Ur: NEGATIVE ng/mL
Benzodiazepine Quant, Ur: NEGATIVE ng/mL
Cannabinoid Quant, Ur: POSITIVE — AB
Cocaine (Metab.): NEGATIVE ng/mL
Opiate Quant, Ur: NEGATIVE ng/mL
PCP Quant, Ur: NEGATIVE ng/mL

## 2020-01-20 ENCOUNTER — Ambulatory Visit (INDEPENDENT_AMBULATORY_CARE_PROVIDER_SITE_OTHER): Payer: Commercial Managed Care - PPO

## 2020-01-20 ENCOUNTER — Other Ambulatory Visit: Payer: Self-pay

## 2020-01-20 ENCOUNTER — Other Ambulatory Visit: Payer: Commercial Managed Care - PPO

## 2020-01-20 ENCOUNTER — Ambulatory Visit (INDEPENDENT_AMBULATORY_CARE_PROVIDER_SITE_OTHER): Payer: Commercial Managed Care - PPO | Admitting: Advanced Practice Midwife

## 2020-01-20 ENCOUNTER — Other Ambulatory Visit: Payer: Self-pay | Admitting: Obstetrics and Gynecology

## 2020-01-20 VITALS — BP 122/74 | Wt 197.0 lb

## 2020-01-20 DIAGNOSIS — Z3A01 Less than 8 weeks gestation of pregnancy: Secondary | ICD-10-CM

## 2020-01-20 DIAGNOSIS — N912 Amenorrhea, unspecified: Secondary | ICD-10-CM | POA: Diagnosis not present

## 2020-01-20 DIAGNOSIS — Z3401 Encounter for supervision of normal first pregnancy, first trimester: Secondary | ICD-10-CM

## 2020-01-20 DIAGNOSIS — Z6830 Body mass index (BMI) 30.0-30.9, adult: Secondary | ICD-10-CM

## 2020-01-20 DIAGNOSIS — O99211 Obesity complicating pregnancy, first trimester: Secondary | ICD-10-CM

## 2020-01-20 NOTE — Progress Notes (Signed)
1 hr gtt today and u/s. No vb. No lof.

## 2020-01-20 NOTE — Progress Notes (Signed)
Routine Prenatal Care Visit  Subjective  Vanessa Thornton is a 24 y.o. G1P0000 at [redacted]w[redacted]d being seen today for ongoing prenatal care.  She is currently monitored for the following issues for this low-risk pregnancy and has Family history of ovarian cancer; Encounter for supervision of normal first pregnancy in first trimester; and Obesity affecting pregnancy in first trimester on their problem list.  ----------------------------------------------------------------------------------- Patient reports no complaints.  We discussed NOB lab results and dating scan. She has stopped using MJ and vaping.  . Vag. Bleeding: None.   . Leaking Fluid denies.  ----------------------------------------------------------------------------------- The following portions of the patient's history were reviewed and updated as appropriate: allergies, current medications, past family history, past medical history, past social history, past surgical history and problem list. Problem list updated.  Objective  Blood pressure 122/74, weight 197 lb (89.4 kg), last menstrual period 11/28/2019. Pregravid weight 194 lb (88 kg) Total Weight Gain 3 lb (1.361 kg) Urinalysis: Urine Protein    Urine Glucose    Fetal Status: Fetal Heart Rate (bpm): 137          Dating: unclear fetal pole margins- recommend repeat for accurate dating. [redacted]w[redacted]d by today's u/s with EDD 8/14- 7 day difference. Will repeat dating in 1 week for accuracy.  General:  Alert, oriented and cooperative. Patient is in no acute distress.  Skin: Skin is warm and dry. No rash noted.   Cardiovascular: Normal heart rate noted  Respiratory: Normal respiratory effort, no problems with respiration noted  Abdomen: Soft, gravid, appropriate for gestational age. Pain/Pressure: Absent     Pelvic:  Cervical exam deferred        Extremities: Normal range of motion.     Mental Status: Normal mood and affect. Normal behavior. Normal judgment and thought content.   Assessment    24 y.o. G1P0000 at [redacted]w[redacted]d by  09/03/2020, by Last Menstrual Period presenting for routine prenatal visit  Plan   FIRST Problems (from 01/07/20 to present)    Problem Noted Resolved   Encounter for supervision of normal first pregnancy in first trimester 01/07/2020 by Zipporah Plants, CNM No   Overview Addendum 01/07/2020  9:26 AM by Zipporah Plants, CNM    Clinic [facility] Prenatal Labs  Dating  LMP - [place] ordered Blood type:     Genetic Screen  NIPS: Antibody:   Anatomic [place]  Rubella:   Varicella: @VZVIGG @  GTT Early:               Third trimester:  RPR:     Rhogam  HBsAg:     TDaP vaccine                       Flu Shot: HIV:     Baby Food                                GBS:   Contraception  Pap:  CBB     CS/VBAC    Support Person  Alex           Previous Version   Obesity affecting pregnancy in first trimester 01/07/2020 by 14/10/2019, CNM No   Overview Signed 01/07/2020  9:27 AM by 14/10/2019, CNM    Family hx of DM (mother with DM and GDM). Patient reports 40lb weight loss in last year. Offered early 1h GTT.          Preterm labor symptoms and  general obstetric precautions including but not limited to vaginal bleeding, contractions, leaking of fluid and fetal movement were reviewed in detail with the patient.   Return in about 1 week (around 01/27/2020) for repeat dating and rob.  Tresea Mall, CNM 01/20/2020 11:07 AM

## 2020-01-21 LAB — GLUCOSE, 1 HOUR GESTATIONAL: Gestational Diabetes Screen: 102 mg/dL (ref 65–139)

## 2020-01-24 ENCOUNTER — Encounter: Payer: Self-pay | Admitting: Obstetrics and Gynecology

## 2020-01-24 ENCOUNTER — Other Ambulatory Visit: Payer: Self-pay

## 2020-01-24 ENCOUNTER — Other Ambulatory Visit: Payer: Self-pay | Admitting: Advanced Practice Midwife

## 2020-01-24 ENCOUNTER — Ambulatory Visit (INDEPENDENT_AMBULATORY_CARE_PROVIDER_SITE_OTHER): Payer: Commercial Managed Care - PPO

## 2020-01-24 ENCOUNTER — Ambulatory Visit (INDEPENDENT_AMBULATORY_CARE_PROVIDER_SITE_OTHER): Payer: Commercial Managed Care - PPO | Admitting: Obstetrics and Gynecology

## 2020-01-24 VITALS — BP 118/72 | Wt 197.0 lb

## 2020-01-24 DIAGNOSIS — Z3A01 Less than 8 weeks gestation of pregnancy: Secondary | ICD-10-CM

## 2020-01-24 DIAGNOSIS — Z3401 Encounter for supervision of normal first pregnancy, first trimester: Secondary | ICD-10-CM | POA: Diagnosis not present

## 2020-01-24 DIAGNOSIS — Z3A08 8 weeks gestation of pregnancy: Secondary | ICD-10-CM

## 2020-01-24 NOTE — Patient Instructions (Signed)
First Trimester of Pregnancy The first trimester of pregnancy is from week 1 until the end of week 13 (months 1 through 3). A week after a sperm fertilizes an egg, the egg will implant on the wall of the uterus. This embryo will begin to develop into a baby. Genes from you and your partner will form the baby. The female genes will determine whether the baby will be a boy or a girl. At 6-8 weeks, the eyes and face will be formed, and the heartbeat can be seen on ultrasound. At the end of 12 weeks, all the baby's organs will be formed. Now that you are pregnant, you will want to do everything you can to have a healthy baby. Two of the most important things are to get good prenatal care and to follow your health care provider's instructions. Prenatal care is all the medical care you receive before the baby's birth. This care will help prevent, find, and treat any problems during the pregnancy and childbirth. Body changes during your first trimester Your body goes through many changes during pregnancy. The changes vary from woman to woman.  You may gain or lose a couple of pounds at first.  You may feel sick to your stomach (nauseous) and you may throw up (vomit). If the vomiting is uncontrollable, call your health care provider.  You may tire easily.  You may develop headaches that can be relieved by medicines. All medicines should be approved by your health care provider.  You may urinate more often. Painful urination may mean you have a bladder infection.  You may develop heartburn as a result of your pregnancy.  You may develop constipation because certain hormones are causing the muscles that push stool through your intestines to slow down.  You may develop hemorrhoids or swollen veins (varicose veins).  Your breasts may begin to grow larger and become tender. Your nipples may stick out more, and the tissue that surrounds them (areola) may become darker.  Your gums may bleed and may be  sensitive to brushing and flossing.  Dark spots or blotches (chloasma, mask of pregnancy) may develop on your face. This will likely fade after the baby is born.  Your menstrual periods will stop.  You may have a loss of appetite.  You may develop cravings for certain kinds of food.  You may have changes in your emotions from day to day, such as being excited to be pregnant or being concerned that something may go wrong with the pregnancy and baby.  You may have more vivid and strange dreams.  You may have changes in your hair. These can include thickening of your hair, rapid growth, and changes in texture. Some women also have hair loss during or after pregnancy, or hair that feels dry or thin. Your hair will most likely return to normal after your baby is born. What to expect at prenatal visits During a routine prenatal visit:  You will be weighed to make sure you and the baby are growing normally.  Your blood pressure will be taken.  Your abdomen will be measured to track your baby's growth.  The fetal heartbeat will be listened to between weeks 10 and 14 of your pregnancy.  Test results from any previous visits will be discussed. Your health care provider may ask you:  How you are feeling.  If you are feeling the baby move.  If you have had any abnormal symptoms, such as leaking fluid, bleeding, severe headaches, or abdominal   cramping.  If you are using any tobacco products, including cigarettes, chewing tobacco, and electronic cigarettes.  If you have any questions. Other tests that may be performed during your first trimester include:  Blood tests to find your blood type and to check for the presence of any previous infections. The tests will also be used to check for low iron levels (anemia) and protein on red blood cells (Rh antibodies). Depending on your risk factors, or if you previously had diabetes during pregnancy, you may have tests to check for high blood sugar  that affects pregnant women (gestational diabetes).  Urine tests to check for infections, diabetes, or protein in the urine.  An ultrasound to confirm the proper growth and development of the baby.  Fetal screens for spinal cord problems (spina bifida) and Down syndrome.  HIV (human immunodeficiency virus) testing. Routine prenatal testing includes screening for HIV, unless you choose not to have this test.  You may need other tests to make sure you and the baby are doing well. Follow these instructions at home: Medicines  Follow your health care provider's instructions regarding medicine use. Specific medicines may be either safe or unsafe to take during pregnancy.  Take a prenatal vitamin that contains at least 600 micrograms (mcg) of folic acid.  If you develop constipation, try taking a stool softener if your health care provider approves. Eating and drinking   Eat a balanced diet that includes fresh fruits and vegetables, whole grains, good sources of protein such as meat, eggs, or tofu, and low-fat dairy. Your health care provider will help you determine the amount of weight gain that is right for you.  Avoid raw meat and uncooked cheese. These carry germs that can cause birth defects in the baby.  Eating four or five small meals rather than three large meals a day may help relieve nausea and vomiting. If you start to feel nauseous, eating a few soda crackers can be helpful. Drinking liquids between meals, instead of during meals, also seems to help ease nausea and vomiting.  Limit foods that are high in fat and processed sugars, such as fried and sweet foods.  To prevent constipation: ? Eat foods that are high in fiber, such as fresh fruits and vegetables, whole grains, and beans. ? Drink enough fluid to keep your urine clear or pale yellow. Activity  Exercise only as directed by your health care provider. Most women can continue their usual exercise routine during  pregnancy. Try to exercise for 30 minutes at least 5 days a week. Exercising will help you: ? Control your weight. ? Stay in shape. ? Be prepared for labor and delivery.  Experiencing pain or cramping in the lower abdomen or lower back is a good sign that you should stop exercising. Check with your health care provider before continuing with normal exercises.  Try to avoid standing for long periods of time. Move your legs often if you must stand in one place for a long time.  Avoid heavy lifting.  Wear low-heeled shoes and practice good posture.  You may continue to have sex unless your health care provider tells you not to. Relieving pain and discomfort  Wear a good support bra to relieve breast tenderness.  Take warm sitz baths to soothe any pain or discomfort caused by hemorrhoids. Use hemorrhoid cream if your health care provider approves.  Rest with your legs elevated if you have leg cramps or low back pain.  If you develop varicose veins in   your legs, wear support hose. Elevate your feet for 15 minutes, 3-4 times a day. Limit salt in your diet. Prenatal care  Schedule your prenatal visits by the twelfth week of pregnancy. They are usually scheduled monthly at first, then more often in the last 2 months before delivery.  Write down your questions. Take them to your prenatal visits.  Keep all your prenatal visits as told by your health care provider. This is important. Safety  Wear your seat belt at all times when driving.  Make a list of emergency phone numbers, including numbers for family, friends, the hospital, and police and fire departments. General instructions  Ask your health care provider for a referral to a local prenatal education class. Begin classes no later than the beginning of month 6 of your pregnancy.  Ask for help if you have counseling or nutritional needs during pregnancy. Your health care provider can offer advice or refer you to specialists for help  with various needs.  Do not use hot tubs, steam rooms, or saunas.  Do not douche or use tampons or scented sanitary pads.  Do not cross your legs for long periods of time.  Avoid cat litter boxes and soil used by cats. These carry germs that can cause birth defects in the baby and possibly loss of the fetus by miscarriage or stillbirth.  Avoid all smoking, herbs, alcohol, and medicines not prescribed by your health care provider. Chemicals in these products affect the formation and growth of the baby.  Do not use any products that contain nicotine or tobacco, such as cigarettes and e-cigarettes. If you need help quitting, ask your health care provider. You may receive counseling support and other resources to help you quit.  Schedule a dentist appointment. At home, brush your teeth with a soft toothbrush and be gentle when you floss. Contact a health care provider if:  You have dizziness.  You have mild pelvic cramps, pelvic pressure, or nagging pain in the abdominal area.  You have persistent nausea, vomiting, or diarrhea.  You have a bad smelling vaginal discharge.  You have pain when you urinate.  You notice increased swelling in your face, hands, legs, or ankles.  You are exposed to fifth disease or chickenpox.  You are exposed to German measles (rubella) and have never had it. Get help right away if:  You have a fever.  You are leaking fluid from your vagina.  You have spotting or bleeding from your vagina.  You have severe abdominal cramping or pain.  You have rapid weight gain or loss.  You vomit blood or material that looks like coffee grounds.  You develop a severe headache.  You have shortness of breath.  You have any kind of trauma, such as from a fall or a car accident. Summary  The first trimester of pregnancy is from week 1 until the end of week 13 (months 1 through 3).  Your body goes through many changes during pregnancy. The changes vary from  woman to woman.  You will have routine prenatal visits. During those visits, your health care provider will examine you, discuss any test results you may have, and talk with you about how you are feeling. This information is not intended to replace advice given to you by your health care provider. Make sure you discuss any questions you have with your health care provider. Document Revised: 12/27/2016 Document Reviewed: 12/27/2015 Elsevier Patient Education  2020 [employer]  

## 2020-01-24 NOTE — Progress Notes (Signed)
Routine Prenatal Care Visit  Subjective  Vanessa Thornton is a 24 y.o. G1P0000 at [redacted]w[redacted]d being seen today for ongoing prenatal care.  She is currently monitored for the following issues for this low-risk pregnancy and has Family history of ovarian cancer; Encounter for supervision of normal first pregnancy in first trimester; and Obesity affecting pregnancy in first trimester on their problem list.  ----------------------------------------------------------------------------------- Patient reports no complaints.  Reports some difficulty sleeping since pregnancy.   . Vag. Bleeding: None.  Movement: Absent. Denies leaking of fluid.  ----------------------------------------------------------------------------------- The following portions of the patient's history were reviewed and updated as appropriate: allergies, current medications, past family history, past medical history, past social history, past surgical history and problem list. Problem list updated.   Objective  Blood pressure 118/72, weight 197 lb (89.4 kg), last menstrual period 11/28/2019. Pregravid weight 194 lb (88 kg) Total Weight Gain 3 lb (1.361 kg) Urinalysis:      Fetal Status: Fetal Heart Rate (bpm): 160   Movement: Absent     General:  Alert, oriented and cooperative. Patient is in no acute distress.  Skin: Skin is warm and dry. No rash noted.   Cardiovascular: Normal heart rate noted  Respiratory: Normal respiratory effort, no problems with respiration noted  Abdomen: Soft, gravid, appropriate for gestational age. Pain/Pressure: Absent     Pelvic:  Cervical exam deferred        Extremities: Normal range of motion.  Edema: None  Mental Status: Normal mood and affect. Normal behavior. Normal judgment and thought content.     Assessment   24 y.o. G1P0000 at [redacted]w[redacted]d by  09/03/2020, by Last Menstrual Period presenting for routine prenatal visit  Plan   FIRST Problems (from 01/07/20 to present)    Problem Noted  Resolved   Encounter for supervision of normal first pregnancy in first trimester 01/07/2020 by Zipporah Plants, CNM No   Overview Addendum 01/24/2020  5:30 PM by Natale Milch, MD     Nursing Staff Provider  Office Location  [facility] Dating   LMP = 6 wk [place]  Language  English Anatomy US    Flu Vaccine   Genetic Screen  NIPS:  desired   TDaP vaccine    Hgb A1C or  GTT Early : 102 Third trimester :   Rhogam   not needed   LAB RESULTS   Feeding Plan  Blood Type O/Positive/-- (12/10 0911)   Contraception  Antibody Negative (12/10 0911)  Circumcision  Rubella 2.25 (12/10 0911)  Pediatrician   RPR Non Reactive (12/10 0911)   Support Person  HBsAg Negative (12/10 0911)   Prenatal Classes  HIV Non Reactive (12/10 0911)    Varicella Non-immune  BTL Consent  GBS  (For PCN allergy, check sensitivities)        VBAC Consent  Pap  NIL 2021    Hgb Electro    COVID vaccinated CF      SMA               Previous Version   Obesity affecting pregnancy in first trimester 01/07/2020 by Zipporah Plants, CNM No   Overview Signed 01/07/2020  9:27 AM by Zipporah Plants, CNM    Family hx of DM (mother with DM and GDM). Patient reports 40lb weight loss in last year. Offered early 1h GTT.          Desires NIPS screening and carrier screening next visit. Given price checking information.   Gestational age appropriate obstetric precautions  including but not limited to vaginal bleeding, contractions, leaking of fluid and fetal movement were reviewed in detail with the patient.    Return in about 2 weeks (around 02/07/2020) for ROB in person.  Natale Milch MD [facility], [facility] 01/24/2020, 5:31 PM

## 2020-01-29 NOTE — L&D Delivery Note (Addendum)
Date of delivery: 08/28/2020 Estimated Date of Delivery: 09/03/20 Patient's last menstrual period was 11/28/2019. EGA: [redacted]w[redacted]d  Delivery Note At 2:25 PM a viable female was delivered via Vaginal, Spontaneous  Presentation: OA, Left Occiput Anterior   APGAR: 8, 9;  Weight: 3740 g, 8 pounds 4 ounces Placenta status: Spontaneous, Intact.   Cord: 3 vessels with the following complications: None.  Cord pH: NA  Mom pushed to deliver a viable female infant.  The head followed by shoulders, which delivered without difficulty, and the rest of the body.  Nuchal cord x3 noted and reduced on the perineum.  Baby to mom's chest.  Cord clamped and cut after 3 min delay.  Cord blood obtained.  Placenta delivered spontaneously, intact, with a 3-vessel cord.  Second degree perineal laceration repaired with 3-0 Vicryl in standard fashion.  All counts correct.  Hemostasis obtained with IV pitocin and fundal massage.  Anesthesia: Epidural Episiotomy: None Lacerations: 2nd degree Suture Repair: 3.0 vicryl Est. Blood Loss (mL):  800 Additional 400 ml during recovery  Mom to postpartum.  Baby to Couplet care / Skin to Skin.   Tresea Mall, CNM 08/28/2020, 3:04 PM

## 2020-02-09 ENCOUNTER — Ambulatory Visit (INDEPENDENT_AMBULATORY_CARE_PROVIDER_SITE_OTHER): Payer: Commercial Managed Care - PPO | Admitting: Obstetrics and Gynecology

## 2020-02-09 ENCOUNTER — Other Ambulatory Visit: Payer: Self-pay

## 2020-02-09 ENCOUNTER — Encounter: Payer: Self-pay | Admitting: Obstetrics and Gynecology

## 2020-02-09 VITALS — BP 122/70 | Wt 202.0 lb

## 2020-02-09 DIAGNOSIS — Z1379 Encounter for other screening for genetic and chromosomal anomalies: Secondary | ICD-10-CM

## 2020-02-09 DIAGNOSIS — Z3401 Encounter for supervision of normal first pregnancy, first trimester: Secondary | ICD-10-CM

## 2020-02-09 DIAGNOSIS — Z3A10 10 weeks gestation of pregnancy: Secondary | ICD-10-CM

## 2020-02-09 DIAGNOSIS — Z3149 Encounter for other procreative investigation and testing: Secondary | ICD-10-CM

## 2020-02-09 NOTE — Progress Notes (Signed)
Routine Prenatal Care Visit  Subjective  Vanessa Thornton is a 25 y.o. G1P0000 at [redacted]w[redacted]d being seen today for ongoing prenatal care.  She is currently monitored for the following issues for this low-risk pregnancy and has Family history of ovarian cancer; Encounter for supervision of normal first pregnancy in first trimester; and Obesity affecting pregnancy in first trimester on their problem list.  ----------------------------------------------------------------------------------- Patient reports headaches intermittently, some relief with tylenol. Some cramping. Denies VB.   . Vag. Bleeding: None.  Movement: Absent. Denies leaking of fluid.  ----------------------------------------------------------------------------------- The following portions of the patient's history were reviewed and updated as appropriate: allergies, current medications, past family history, past medical history, past social history, past surgical history and problem list. Problem list updated.   Objective  Blood pressure 122/70, weight 202 lb (91.6 kg), last menstrual period 11/28/2019. Pregravid weight 194 lb (88 kg) Total Weight Gain 8 lb (3.629 kg) Urinalysis:      Fetal Status:     Movement: Absent     General:  Alert, oriented and cooperative. Patient is in no acute distress.  Skin: Skin is warm and dry. No rash noted.   Cardiovascular: Normal heart rate noted  Respiratory: Normal respiratory effort, no problems with respiration noted  Abdomen: Soft, gravid, appropriate for gestational age. Pain/Pressure: Absent     Pelvic:  Cervical exam deferred        Extremities: Normal range of motion.     ental Status: Normal mood and affect. Normal behavior. Normal judgment and thought content.     Assessment   25 y.o. G1P0000 at [redacted]w[redacted]d by  09/03/2020, by Last Menstrual Period presenting for routine prenatal visit  Plan   FIRST Problems (from 01/07/20 to present)    Problem Noted Resolved   Encounter for  supervision of normal first pregnancy in first trimester 01/07/2020 by Zipporah Plants, CNM No   Overview Addendum 01/24/2020  5:30 PM by Natale Milch, MD     Nursing Staff Provider  Office Location  [facility] Dating   LMP = 6 wk [place]  Language  English Anatomy US    Flu Vaccine   Genetic Screen  NIPS:  desired   TDaP vaccine    Hgb A1C or  GTT Early : 102 Third trimester :   Rhogam   not needed   LAB RESULTS   Feeding Plan  Blood Type O/Positive/-- (12/10 0911)   Contraception  Antibody Negative (12/10 0911)  Circumcision  Rubella 2.25 (12/10 0911)  Pediatrician   RPR Non Reactive (12/10 0911)   Support Person  HBsAg Negative (12/10 0911)   Prenatal Classes  HIV Non Reactive (12/10 0911)    Varicella Non-immune  BTL Consent  GBS  (For PCN allergy, check sensitivities)        VBAC Consent  Pap  NIL 2021    Hgb Electro    COVID vaccinated CF      SMA               Previous Version   Obesity affecting pregnancy in first trimester 01/07/2020 by Zipporah Plants, CNM No   Overview Signed 01/07/2020  9:27 AM by Zipporah Plants, CNM    Family hx of DM (mother with DM and GDM). Patient reports 40lb weight loss in last year. Offered early 1h GTT.         -NIPS and Inheritest collected today -Discussed tylenol and caffeine for HAs.   First trimester precautions including but not limited to vaginal  bleeding and cramping were reviewed in detail with the patient.    Return in about 4 weeks (around 03/08/2020) for ROB.  Zipporah Plants, CNM, MSN [facility], [facility] 02/09/2020, 4:39 PM

## 2020-02-14 LAB — MATERNIT 21 PLUS CORE, BLOOD
Fetal Fraction: 3
Result (T21): NEGATIVE
Trisomy 13 (Patau syndrome): NEGATIVE
Trisomy 18 (Edwards syndrome): NEGATIVE
Trisomy 21 (Down syndrome): NEGATIVE

## 2020-02-22 LAB — INHERITEST CORE(CF97,SMA,FRAX)

## 2020-03-08 ENCOUNTER — Other Ambulatory Visit: Payer: Self-pay

## 2020-03-08 ENCOUNTER — Ambulatory Visit (INDEPENDENT_AMBULATORY_CARE_PROVIDER_SITE_OTHER): Payer: Commercial Managed Care - PPO | Admitting: Obstetrics

## 2020-03-08 VITALS — BP 112/70 | Wt 205.0 lb

## 2020-03-08 DIAGNOSIS — Z3A13 13 weeks gestation of pregnancy: Secondary | ICD-10-CM

## 2020-03-08 DIAGNOSIS — Z3480 Encounter for supervision of other normal pregnancy, unspecified trimester: Secondary | ICD-10-CM

## 2020-03-08 LAB — POCT URINALYSIS DIPSTICK OB
Glucose, UA: NEGATIVE
POC,PROTEIN,UA: NEGATIVE

## 2020-03-08 NOTE — Progress Notes (Signed)
Routine Prenatal Care Visit  Subjective  Vanessa Thornton is a 25 y.o. G1P0000 at [redacted]w[redacted]d being seen today for ongoing prenatal care.  She is currently monitored for the following issues for this high-risk pregnancy and has Family history of ovarian cancer; Encounter for supervision of normal first pregnancy in first trimester; and Obesity affecting pregnancy in first trimester on their problem list.  ----------------------------------------------------------------------------------- Patient reports no complaints.    . Vag. Bleeding: None.  Movement: Absent. Leaking Fluid denies.  ----------------------------------------------------------------------------------- The following portions of the patient's history were reviewed and updated as appropriate: allergies, current medications, past family history, past medical history, past social history, past surgical history and problem list. Problem list updated.  Objective  Blood pressure 112/70, weight 205 lb (93 kg), last menstrual period 11/28/2019. Pregravid weight 194 lb (88 kg) Total Weight Gain 11 lb (4.99 kg) Urinalysis: Urine Protein    Urine Glucose    Fetal Status:     Movement: Absent     General:  Alert, oriented and cooperative. Patient is in no acute distress.  Skin: Skin is warm and dry. No rash noted.   Cardiovascular: Normal heart rate noted  Respiratory: Normal respiratory effort, no problems with respiration noted  Abdomen: Soft, gravid, appropriate for gestational age. Pain/Pressure: Present     Pelvic:  Cervical exam deferred        Extremities: Normal range of motion.     Mental Status: Normal mood and affect. Normal behavior. Normal judgment and thought content.   Assessment   25 y.o. G1P0000 at [redacted]w[redacted]d by  09/03/2020, by Last Menstrual Period presenting for routine prenatal visit  Plan   FIRST Problems (from 01/07/20 to present)    Problem Noted Resolved   Encounter for supervision of normal first pregnancy in first  trimester 01/07/2020 by Zipporah Plants, CNM No   Overview Addendum 02/23/2020  8:29 AM by Zipporah Plants, CNM     Nursing Staff Provider  Office Location  [facility] Dating   LMP = 6 wk [place]  Language  English Anatomy US    Flu Vaccine   Genetic Screen  NIPS:  Neg x 3 - XX   TDaP vaccine    Hgb A1C or  GTT Early : 102 Third trimester :   Rhogam   not needed   LAB RESULTS   Feeding Plan  Blood Type O/Positive/-- (12/10 0911)   Contraception  Antibody Negative (12/10 0911)  Circumcision  Rubella 2.25 (12/10 0911)  Pediatrician   RPR Non Reactive (12/10 0911)   Support Person  HBsAg Negative (12/10 0911)   Prenatal Classes  HIV Non Reactive (12/10 0911)    Varicella Non-immune  BTL Consent  GBS  (For PCN allergy, check sensitivities)        VBAC Consent  Pap  NIL 2021    Hgb Electro    COVID vaccinated CF  negative     SMA  negative              Previous Version   Obesity affecting pregnancy in first trimester 01/07/2020 by Zipporah Plants, CNM No   Overview Signed 01/07/2020  9:27 AM by Zipporah Plants, CNM    Family hx of DM (mother with DM and GDM). Patient reports 40lb weight loss in last year. Offered early 1h GTT.          Preterm labor symptoms and general obstetric precautions including but not limited to vaginal bleeding, contractions, leaking of fluid and fetal movement were reviewed in  detail with the patient. Please refer to After Visit Summary for other counseling recommendations.   Return in about 3 weeks (around 03/29/2020) for return OB.  Mirna Mires, CNM  03/08/2020 10:01 AM

## 2020-03-08 NOTE — Addendum Note (Signed)
Addended by: Cornelius Moras D on: 03/08/2020 10:21 AM   Modules accepted: Orders

## 2020-03-29 ENCOUNTER — Other Ambulatory Visit: Payer: Self-pay

## 2020-03-29 ENCOUNTER — Ambulatory Visit (INDEPENDENT_AMBULATORY_CARE_PROVIDER_SITE_OTHER): Payer: Commercial Managed Care - PPO | Admitting: Obstetrics

## 2020-03-29 VITALS — BP 112/72 | Ht 62.0 in | Wt 208.2 lb

## 2020-03-29 DIAGNOSIS — Z3401 Encounter for supervision of normal first pregnancy, first trimester: Secondary | ICD-10-CM

## 2020-03-29 DIAGNOSIS — Z3A17 17 weeks gestation of pregnancy: Secondary | ICD-10-CM

## 2020-03-29 LAB — POCT URINALYSIS DIPSTICK OB
Glucose, UA: NEGATIVE
POC,PROTEIN,UA: NEGATIVE

## 2020-03-29 NOTE — Progress Notes (Signed)
Routine Prenatal Care Visit  Subjective  Vanessa Thornton is a 25 y.o. G1P0000 at [redacted]w[redacted]d being seen today for ongoing prenatal care.  She is currently monitored for the following issues for this low-risk pregnancy and has Family history of ovarian cancer; Encounter for supervision of normal first pregnancy in first trimester; and Obesity affecting pregnancy in first trimester on their problem list.  ----------------------------------------------------------------------------------- Patient reports no complaints.  She is proud to have quit both vaping and the MJ use.  . Vag. Bleeding: None.  Movement: Absent. Leaking Fluid denies.  ----------------------------------------------------------------------------------- The following portions of the patient's history were reviewed and updated as appropriate: allergies, current medications, past family history, past medical history, past social history, past surgical history and problem list. Problem list updated.  Objective  Blood pressure 112/72, height 5\' 2"  (1.575 m), weight 208 lb 3.2 oz (94.4 kg), last menstrual period 11/28/2019. Pregravid weight 194 lb (88 kg) Total Weight Gain 14 lb 3.2 oz (6.441 kg) Urinalysis: Urine Protein    Urine Glucose    Fetal Status:     Movement: Absent     General:  Alert, oriented and cooperative. Patient is in no acute distress.  Skin: Skin is warm and dry. No rash noted.   Cardiovascular: Normal heart rate noted  Respiratory: Normal respiratory effort, no problems with respiration noted  Abdomen: Soft, gravid, appropriate for gestational age. Pain/Pressure: Absent     Pelvic:  Cervical exam deferred        Extremities: Normal range of motion.     Mental Status: Normal mood and affect. Normal behavior. Normal judgment and thought content.   Assessment   25 y.o. G1P0000 at [redacted]w[redacted]d by  09/03/2020, by Last Menstrual Period presenting for routine prenatal visit  Plan   FIRST Problems (from 01/07/20 to present)     Problem Noted Resolved   Encounter for supervision of normal first pregnancy in first trimester 01/07/2020 by 14/10/2019, CNM No   Overview Addendum 02/23/2020  8:29 AM by 02/25/2020, CNM     Nursing Staff Provider  Office Location  [facility] Dating   LMP = 6 wk Zipporah Plants  Language  English Anatomy US    Flu Vaccine   Genetic Screen  NIPS:  Neg x 3 - XX   TDaP vaccine    Hgb A1C or  GTT Early : 102 Third trimester :   Rhogam   not needed   LAB RESULTS   Feeding Plan  Blood Type O/Positive/-- (12/10 0911)   Contraception  Antibody Negative (12/10 0911)  Circumcision  Rubella 2.25 (12/10 0911)  Pediatrician   RPR Non Reactive (12/10 0911)   Support Person  HBsAg Negative (12/10 0911)   Prenatal Classes  HIV Non Reactive (12/10 0911)    Varicella Non-immune  BTL Consent  GBS  (For PCN allergy, check sensitivities)        VBAC Consent  Pap  NIL 2021    Hgb Electro    COVID vaccinated CF  negative     SMA  negative              Previous Version   Obesity affecting pregnancy in first trimester 01/07/2020 by 14/10/2019, CNM No   Overview Signed 01/07/2020  9:27 AM by 14/10/2019, CNM    Family hx of DM (mother with DM and GDM). Patient reports 40lb weight loss in last year. Offered early 1h GTT.          Preterm labor symptoms and  general obstetric precautions including but not limited to vaginal bleeding, contractions, leaking of fluid and fetal movement were reviewed in detail with the patient. Please refer to After Visit Summary for other counseling recommendations.   Return in about 3 weeks (around 04/19/2020) for return OB, anatomy scan.  Mirna Mires, CNM  03/29/2020 10:21 AM

## 2020-04-20 ENCOUNTER — Ambulatory Visit: Payer: Commercial Managed Care - PPO

## 2020-04-20 ENCOUNTER — Encounter: Payer: Commercial Managed Care - PPO | Admitting: Advanced Practice Midwife

## 2020-04-20 DIAGNOSIS — Z3A17 17 weeks gestation of pregnancy: Secondary | ICD-10-CM

## 2020-04-20 DIAGNOSIS — Z3401 Encounter for supervision of normal first pregnancy, first trimester: Secondary | ICD-10-CM

## 2020-04-26 ENCOUNTER — Ambulatory Visit
Admission: RE | Admit: 2020-04-26 | Discharge: 2020-04-26 | Disposition: A | Discharge status: Home / Self Care | Payer: Commercial Managed Care - PPO | Source: Ambulatory Visit | Attending: Obstetrics | Admitting: Obstetrics

## 2020-04-26 ENCOUNTER — Other Ambulatory Visit: Payer: Self-pay

## 2020-04-26 DIAGNOSIS — Z3401 Encounter for supervision of normal first pregnancy, first trimester: Secondary | ICD-10-CM | POA: Diagnosis not present

## 2020-04-26 DIAGNOSIS — Z3A21 21 weeks gestation of pregnancy: Secondary | ICD-10-CM | POA: Diagnosis not present

## 2020-04-26 DIAGNOSIS — Z369 Encounter for antenatal screening, unspecified: Secondary | ICD-10-CM | POA: Insufficient documentation

## 2020-04-27 ENCOUNTER — Encounter: Payer: Self-pay | Admitting: Advanced Practice Midwife

## 2020-04-27 ENCOUNTER — Ambulatory Visit (INDEPENDENT_AMBULATORY_CARE_PROVIDER_SITE_OTHER): Payer: Commercial Managed Care - PPO | Admitting: Advanced Practice Midwife

## 2020-04-27 VITALS — BP 122/74 | Wt 218.0 lb

## 2020-04-27 DIAGNOSIS — O0992 Supervision of high risk pregnancy, unspecified, second trimester: Secondary | ICD-10-CM

## 2020-04-27 DIAGNOSIS — O99212 Obesity complicating pregnancy, second trimester: Secondary | ICD-10-CM

## 2020-04-27 DIAGNOSIS — Z3A21 21 weeks gestation of pregnancy: Secondary | ICD-10-CM

## 2020-04-27 NOTE — Progress Notes (Signed)
Anatomy scan today. No vb. No lof.  ?

## 2020-04-27 NOTE — Progress Notes (Signed)
Routine Prenatal Care Visit  Subjective  Vanessa Thornton is a 25 y.o. G1P0000 at [redacted]w[redacted]d being seen today for ongoing prenatal care.  She is currently monitored for the following issues for this high-risk pregnancy and has Family history of ovarian cancer; Supervision of high risk pregnancy, antepartum; and Obesity affecting pregnancy in first trimester on their problem list.  ----------------------------------------------------------------------------------- Patient reports no complaints.    . Vag. Bleeding: None.  Movement: Present. Leaking Fluid denies.  ----------------------------------------------------------------------------------- The following portions of the patient's history were reviewed and updated as appropriate: allergies, current medications, past family history, past medical history, past social history, past surgical history and problem list. Problem list updated.  Objective  Blood pressure 122/74, weight 218 lb (98.9 kg), last menstrual period 11/28/2019. Pregravid weight 194 lb (88 kg) Total Weight Gain 24 lb (10.9 kg) Urinalysis: Urine Protein    Urine Glucose    Fetal Status: Fetal Heart Rate (bpm): 145 Fundal Height: 21 cm Movement: Present      Anatomy scan: done yesterday: complete, normal, female, placenta posterior, breech  General:  Alert, oriented and cooperative. Patient is in no acute distress.  Skin: Skin is warm and dry. No rash noted.   Cardiovascular: Normal heart rate noted  Respiratory: Normal respiratory effort, no problems with respiration noted  Abdomen: Soft, gravid, appropriate for gestational age. Pain/Pressure: Absent     Pelvic:  Cervical exam deferred        Extremities: Normal range of motion.  Edema: None  Mental Status: Normal mood and affect. Normal behavior. Normal judgment and thought content.   Assessment   25 y.o. G1P0000 at [redacted]w[redacted]d by  09/03/2020, by Last Menstrual Period presenting for routine prenatal visit  Plan   FIRST  Problems (from 01/07/20 to present)    Problem Noted Resolved   Supervision of high risk pregnancy, antepartum 01/07/2020 by Zipporah Plants, CNM No   Overview Addendum 04/27/2020  1:23 PM by Mirna Mires, CNM     Nursing Staff Provider  Office Location  [facility] Dating   LMP = 6 wk [place]  Language  English Anatomy US  Normal anatomy, female  Flu Vaccine   Genetic Screen  NIPS:  Neg x 3 - XX   TDaP vaccine    Hgb A1C or  GTT Early : 102 Third trimester :   Rhogam   not needed   LAB RESULTS   Feeding Plan  Blood Type O/Positive/-- (12/10 0911)   Contraception  Antibody Negative (12/10 0911)  Circumcision  Rubella 2.25 (12/10 0911)  Pediatrician   RPR Non Reactive (12/10 0911)   Support Person  HBsAg Negative (12/10 0911)   Prenatal Classes  HIV Non Reactive (12/10 0911)    Varicella Non-immune  BTL Consent  GBS  (For PCN allergy, check sensitivities)        VBAC Consent  Pap  NIL 2021    Hgb Electro    COVID vaccinated CF  negative     SMA  negative              Previous Version   Obesity affecting pregnancy in first trimester 01/07/2020 by Zipporah Plants, CNM No   Overview Signed 01/07/2020  9:27 AM by Zipporah Plants, CNM    Family hx of DM (mother with DM and GDM). Patient reports 40lb weight loss in last year. Offered early 1h GTT.          Preterm labor symptoms and general obstetric precautions including but not limited to  vaginal bleeding, contractions, leaking of fluid and fetal movement were reviewed in detail with the patient.   Return in about 4 weeks (around 05/25/2020) for rob.  Tresea Mall, CNM 04/27/2020 4:40 PM

## 2020-05-23 ENCOUNTER — Telehealth: Payer: Self-pay

## 2020-05-23 NOTE — Telephone Encounter (Signed)
Pt's hsb, Vanessa Thornton, calling; pt has covid 19; 25wks; fever of 100.5; kept fever down by 4000mg  e.s. tylenol/day and ice packs; wants to be sure that is ok.  [phone](pt)  LMTC

## 2020-05-23 NOTE — Telephone Encounter (Signed)
Christopher, pt's hsb, returned call; adv the following: for aches, pains, fever - e/s/ tylenol; cough - plain musinex (has a lot of mucus), Hall's cough drops; sore throat - warm salt water gargles, Hall's cough drops, hot beverages/soup but watch caffeine intake as only allowed 16oz of caffeine a day incl chocolate; sinus congestion, ears stopped up - plain sudafed - if ears become painful to contact PCP; nasal congestion - saline nasal spray.  Trouble breathing to be seen by PCP or ED.  Covid number for [facility] given.

## 2020-05-25 ENCOUNTER — Ambulatory Visit (INDEPENDENT_AMBULATORY_CARE_PROVIDER_SITE_OTHER): Payer: Commercial Managed Care - PPO | Admitting: Obstetrics

## 2020-05-25 ENCOUNTER — Other Ambulatory Visit: Payer: Self-pay

## 2020-05-25 VITALS — Wt 217.0 lb

## 2020-05-25 DIAGNOSIS — O99212 Obesity complicating pregnancy, second trimester: Secondary | ICD-10-CM

## 2020-05-25 DIAGNOSIS — Z3A25 25 weeks gestation of pregnancy: Secondary | ICD-10-CM

## 2020-05-25 DIAGNOSIS — Z8616 Personal history of COVID-19: Secondary | ICD-10-CM

## 2020-05-25 DIAGNOSIS — E669 Obesity, unspecified: Secondary | ICD-10-CM

## 2020-05-25 DIAGNOSIS — O0990 Supervision of high risk pregnancy, unspecified, unspecified trimester: Secondary | ICD-10-CM

## 2020-05-25 DIAGNOSIS — O99211 Obesity complicating pregnancy, first trimester: Secondary | ICD-10-CM

## 2020-05-25 NOTE — Progress Notes (Signed)
Connected with patient via phone. Verified by two identifiers. Patient test positive for covid confirmed 05/24/20.

## 2020-05-25 NOTE — Progress Notes (Signed)
Routine Prenatal Care Visit  Subjective  Vanessa Thornton is a 25 y.o. G1P0000 at [redacted]w[redacted]d being seen via telephone today. for ongoing prenatal care.  This is avirtual visit ,a dn prior to discussing her care, her name and DOB were confirmed by phone. She is home sequestered due to COVID.She is currently monitored for the following issues for this high-risk pregnancy and has Family history of ovarian cancer; Supervision of high risk pregnancy, antepartum; and Obesity affecting pregnancy in first trimester on their problem list.  ----------------------------------------------------------------------------------- Patient reports no complaints.  She is recovering from COVID. Had several days of baody aches, and has lost her sense of taste largely.  Her baby is moving well. Contractions: Not present. Vag. Bleeding: None.  Movement: Present. Leaking Fluid denies.  ----------------------------------------------------------------------------------- The following portions of the patient's history were reviewed and updated as appropriate: allergies, current medications, past family history, past medical history, past social history, past surgical history and problem list. Problem list updated.  Objective  Weight 217 lb (98.4 kg), last menstrual period 11/28/2019. Pregravid weight 194 lb (88 kg) Total Weight Gain 23 lb (10.4 kg) Fetal Status:     Movement: Present     General:  Alert, oriented and cooperative. Patient is in no acute distress.                        Assessment   25 y.o. G1P0000 at [redacted]w[redacted]d by  09/03/2020, by Last Menstrual Period presenting for virtual prenatal visit Recovering from COVID  Plan   FIRST Problems (from 01/07/20 to present)    Problem Noted Resolved   Supervision of high risk pregnancy, antepartum 01/07/2020 by Zipporah Plants, CNM No   Overview Addendum 04/27/2020  1:23 PM by Mirna Mires, CNM     Nursing Staff Provider  Office Location  [facility] Dating   LMP =  6 wk [place]  Language  English Anatomy US  Normal anatomy, female  Flu Vaccine   Genetic Screen  NIPS:  Neg x 3 - XX   TDaP vaccine    Hgb A1C or  GTT Early : 102 Third trimester :   Rhogam   not needed   LAB RESULTS   Feeding Plan  Blood Type O/Positive/-- (12/10 0911)   Contraception  Antibody Negative (12/10 0911)  Circumcision  Rubella 2.25 (12/10 0911)  Pediatrician   RPR Non Reactive (12/10 0911)   Support Person  HBsAg Negative (12/10 0911)   Prenatal Classes  HIV Non Reactive (12/10 0911)    Varicella Non-immune  BTL Consent  GBS  (For PCN allergy, check sensitivities)        VBAC Consent  Pap  NIL 2021    Hgb Electro    COVID vaccinated CF  negative     SMA  negative              Previous Version   Obesity affecting pregnancy in first trimester 01/07/2020 by Zipporah Plants, CNM No   Overview Signed 01/07/2020  9:27 AM by Zipporah Plants, CNM    Family hx of DM (mother with DM and GDM). Patient reports 40lb weight loss in last year. Offered early 1h GTT.          Preterm labor symptoms and general obstetric precautions including but not limited to vaginal bleeding, contractions, leaking of fluid and fetal movement were reviewed in detail with the patient. Please refer to After Visit Summary for other counseling recommendations.   Return in  about 3 weeks (around 06/15/2020) for return OB. She will have her 28 week labs drawn.  Mirna Mires, CNM  05/25/2020 5:38 PM

## 2020-06-14 ENCOUNTER — Other Ambulatory Visit: Payer: Commercial Managed Care - PPO

## 2020-06-14 ENCOUNTER — Ambulatory Visit (INDEPENDENT_AMBULATORY_CARE_PROVIDER_SITE_OTHER): Payer: Commercial Managed Care - PPO | Admitting: Obstetrics and Gynecology

## 2020-06-14 ENCOUNTER — Other Ambulatory Visit: Payer: Self-pay

## 2020-06-14 ENCOUNTER — Encounter: Payer: Self-pay | Admitting: Obstetrics and Gynecology

## 2020-06-14 ENCOUNTER — Encounter: Payer: Commercial Managed Care - PPO | Admitting: Obstetrics and Gynecology

## 2020-06-14 VITALS — BP 128/72 | Ht 66.0 in | Wt 231.0 lb

## 2020-06-14 DIAGNOSIS — O0993 Supervision of high risk pregnancy, unspecified, third trimester: Secondary | ICD-10-CM

## 2020-06-14 DIAGNOSIS — O99213 Obesity complicating pregnancy, third trimester: Secondary | ICD-10-CM

## 2020-06-14 DIAGNOSIS — Z3A28 28 weeks gestation of pregnancy: Secondary | ICD-10-CM

## 2020-06-14 NOTE — Patient Instructions (Signed)
https://www.nichd.nih.gov/health/topics/labor-delivery/Pages/default.aspx">  Third Trimester of Pregnancy  The third trimester of pregnancy is from week 28 through week 40. This is months 7 through 9. The third trimester is a time when the unborn baby (fetus) is growing rapidly. At the end of the ninth month, the fetus is about 20 inches long and weighs 6-10 pounds. Body changes during your third trimester During the third trimester, your body will continue to go through many changes. The changes vary and generally return to normal after your baby is born. Physical changes  Your weight will continue to increase. You can expect to gain 25-35 pounds (11-16 kg) by the end of the pregnancy if you begin pregnancy at a normal weight. If you are underweight, you can expect to gain 28-40 lb (about 13-18 kg), and if you are overweight, you can expect to gain 15-25 lb (about 7-11 kg).  You may begin to get stretch marks on your hips, abdomen, and breasts.  Your breasts will continue to grow and may hurt. A yellow fluid (colostrum) may leak from your breasts. This is the first milk you are producing for your baby.  You may have changes in your hair. These can include thickening of your hair, rapid growth, and changes in texture. Some people also have hair loss during or after pregnancy, or hair that feels dry or thin.  Your belly button may stick out.  You may notice more swelling in your hands, face, or ankles. Health changes  You may have heartburn.  You may have constipation.  You may develop hemorrhoids.  You may develop swollen, bulging veins (varicose veins) in your legs.  You may have increased body aches in the pelvis, back, or thighs. This is due to weight gain and increased hormones that are relaxing your joints.  You may have increased tingling or numbness in your hands, arms, and legs. The skin on your abdomen may also feel numb.  You may feel short of breath because of your  expanding uterus. Other changes  You may urinate more often because the fetus is moving lower into your pelvis and pressing on your bladder.  You may have more problems sleeping. This may be caused by the size of your abdomen, an increased need to urinate, and an increase in your body's metabolism.  You may notice the fetus "dropping," or moving lower in your abdomen (lightening).  You may have increased vaginal discharge.  You may notice that you have pain around your pelvic bone as your uterus distends. Follow these instructions at home: Medicines  Follow your health care provider's instructions regarding medicine use. Specific medicines may be either safe or unsafe to take during pregnancy. Do not take any medicines unless approved by your health care provider.  Take a prenatal vitamin that contains at least 600 micrograms (mcg) of folic acid. Eating and drinking  Eat a healthy diet that includes fresh fruits and vegetables, whole grains, good sources of protein such as meat, eggs, or tofu, and low-fat dairy products.  Avoid raw meat and unpasteurized juice, milk, and cheese. These carry germs that can harm you and your baby.  Eat 4 or 5 small meals rather than 3 large meals a day.  You may need to take these actions to prevent or treat constipation: ? Drink enough fluid to keep your urine pale yellow. ? Eat foods that are high in fiber, such as beans, whole grains, and fresh fruits and vegetables. ? Limit foods that are high in fat and   processed sugars, such as fried or sweet foods. Activity  Exercise only as directed by your health care provider. Most people can continue their usual exercise routine during pregnancy. Try to exercise for 30 minutes at least 5 days a week. Stop exercising if you experience contractions in the uterus.  Stop exercising if you develop pain or cramping in the lower abdomen or lower back.  Avoid heavy lifting.  Do not exercise if it is very hot or  humid or if you are at a high altitude.  If you choose to, you may continue to have sex unless your health care provider tells you not to. Relieving pain and discomfort  Take frequent breaks and rest with your legs raised (elevated) if you have leg cramps or low back pain.  Take warm sitz baths to soothe any pain or discomfort caused by hemorrhoids. Use hemorrhoid cream if your health care provider approves.  Wear a supportive bra to prevent discomfort from breast tenderness.  If you develop varicose veins: ? Wear support hose as told by your health care provider. ? Elevate your feet for 15 minutes, 3-4 times a day. ? Limit salt in your diet. Safety  Talk to your health care provider before traveling far distances.  Do not use hot tubs, steam rooms, or saunas.  Wear your seat belt at all times when driving or riding in a car.  Talk with your health care provider if someone is verbally or physically abusive to you. Preparing for birth To prepare for the arrival of your baby:  Take prenatal classes to understand, practice, and ask questions about labor and delivery.  Visit the hospital and tour the maternity area.  Purchase a rear-facing car seat and make sure you know how to install it in your car.  Prepare the baby's room or sleeping area. Make sure to remove all pillows and stuffed animals from the baby's crib to prevent suffocation. General instructions  Avoid cat litter boxes and soil used by cats. These carry germs that can cause birth defects in the baby. If you have a cat, ask someone to clean the litter box for you.  Do not douche or use tampons. Do not use scented sanitary pads.  Do not use any products that contain nicotine or tobacco, such as cigarettes, e-cigarettes, and chewing tobacco. If you need help quitting, ask your health care provider.  Do not use any herbal remedies, illegal drugs, or medicines that were not prescribed to you. Chemicals in these products  can harm your baby.  Do not drink alcohol.  You will have more frequent prenatal exams during the third trimester. During a routine prenatal visit, your health care provider will do a physical exam, perform tests, and discuss your overall health. Keep all follow-up visits. This is important. Where to find more information  American Pregnancy Association: americanpregnancy.org  American College of Obstetricians and Gynecologists: acog.org/en/Womens%20Health/Pregnancy  Office on Women's Health: womenshealth.gov/pregnancy Contact a health care provider if you have:  A fever.  Mild pelvic cramps, pelvic pressure, or nagging pain in your abdominal area or lower back.  Vomiting or diarrhea.  Bad-smelling vaginal discharge or foul-smelling urine.  Pain when you urinate.  A headache that does not go away when you take medicine.  Visual changes or see spots in front of your eyes. Get help right away if:  Your water breaks.  You have regular contractions less than 5 minutes apart.  You have spotting or bleeding from your vagina.  You   have severe abdominal pain.  You have difficulty breathing.  You have chest pain.  You have fainting spells.  You have not felt your baby move for the time period told by your health care provider.  You have new or increased pain, swelling, or redness in an arm or leg. Summary  The third trimester of pregnancy is from week 28 through week 40 (months 7 through 9).  You may have more problems sleeping. This can be caused by the size of your abdomen, an increased need to urinate, and an increase in your body's metabolism.  You will have more frequent prenatal exams during the third trimester. Keep all follow-up visits. This is important. This information is not intended to replace advice given to you by your health care provider. Make sure you discuss any questions you have with your health care provider. Document Revised: 06/23/2019 Document  Reviewed: 04/29/2019 Elsevier Patient Education  [employer].   Vaginal Delivery  Vaginal delivery means that you give birth by pushing your baby out of your birth canal (vagina). A team of health care providers will help you before, during, and after vaginal delivery. Birth experiences are unique for every woman and every pregnancy, and birth experiences vary depending on where you choose to give birth. What happens when I arrive at the birth center or hospital? Once you are in labor and have been admitted into the hospital or birth center, your health care provider may:  Review your pregnancy history and any concerns that you have.  Insert an IV into one of your veins. This may be used to give you fluids and medicines.  Check your blood pressure, pulse, temperature, and heart rate (vital signs).  Check whether your bag of water (amniotic sac) has broken (ruptured).  Talk with you about your birth plan and discuss pain control options. Monitoring Your health care provider may monitor your contractions (uterine monitoring) and your baby's heart rate (fetal monitoring). You may need to be monitored:  Often, but not continuously (intermittently).  All the time or for long periods at a time (continuously). Continuous monitoring may be needed if: ? You are taking certain medicines, such as medicine to relieve pain or make your contractions stronger. ? You have pregnancy or labor complications. Monitoring may be done by:  Placing a special stethoscope or a handheld monitoring device on your abdomen to check your baby's heartbeat and to check for contractions.  Placing monitors on your abdomen (external monitors) to record your baby's heartbeat and the frequency and length of contractions.  Placing monitors inside your uterus through your vagina (internal monitors) to record your baby's heartbeat and the frequency, length, and strength of your contractions. Depending on the type of  monitor, it may remain in your uterus or on your baby's head until birth.  Telemetry. This is a type of continuous monitoring that can be done with external or internal monitors. Instead of having to stay in bed, you are able to move around during telemetry. Physical exam Your health care provider may perform frequent physical exams. This may include:  Checking how and where your baby is positioned in your uterus.  Checking your cervix to determine: ? Whether it is thinning out (effacing). ? Whether it is opening up (dilating). What happens during labor and delivery? Normal labor and delivery is divided into the following three stages: Stage 1  This is the longest stage of labor.  This stage can last for hours or days.  Throughout  this stage, you will feel contractions. Contractions generally feel mild, infrequent, and irregular at first. They get stronger, more frequent (about every 2-3 minutes), and more regular as you move through this stage.  This stage ends when your cervix is completely dilated to 4 inches (10 cm) and completely effaced. Stage 2  This stage starts once your cervix is completely effaced and dilated and lasts until the delivery of your baby.  This stage may last from 20 minutes to 2 hours.  This is the stage where you will feel an urge to push your baby out of your vagina.  You may feel stretching and burning pain, especially when the widest part of your baby's head passes through the vaginal opening (crowning).  Once your baby is delivered, the umbilical cord will be clamped and cut. This usually occurs after waiting a period of 1-2 minutes after delivery.  Your baby will be placed on your bare chest (skin-to-skin contact) in an upright position and covered with a warm blanket. Watch your baby for feeding cues, like rooting or sucking, and help the baby to your breast for his or her first feeding. Stage 3  This stage starts immediately after the birth of  your baby and ends after you deliver the placenta.  This stage may take anywhere from 5 to 30 minutes.  After your baby has been delivered, you will feel contractions as your body expels the placenta and your uterus contracts to control bleeding.   What can I expect after labor and delivery?  After labor is over, you and your baby will be monitored closely until you are ready to go home to ensure that you are both healthy. Your health care team will teach you how to care for yourself and your baby.  You and your baby will stay in the same room (rooming in) during your hospital stay. This will encourage early bonding and successful breastfeeding.  You may continue to receive fluids and medicines through an IV.  Your uterus will be checked and massaged regularly (fundal massage).  You will have some soreness and pain in your abdomen, vagina, and the area of skin between your vaginal opening and your anus (perineum).  If an incision was made near your vagina (episiotomy) or if you had some vaginal tearing during delivery, cold compresses may be placed on your episiotomy or your tear. This helps to reduce pain and swelling.  You may be given a squirt bottle to use instead of wiping when you go to the bathroom. To use the squirt bottle, follow these steps: ? Before you urinate, fill the squirt bottle with warm water. Do not use hot water. ? After you urinate, while you are sitting on the toilet, use the squirt bottle to rinse the area around your urethra and vaginal opening. This rinses away any urine and blood. ? Fill the squirt bottle with clean water every time you use the bathroom.  It is normal to have vaginal bleeding after delivery. Wear a sanitary pad for vaginal bleeding and discharge. Summary  Vaginal delivery means that you will give birth by pushing your baby out of your birth canal (vagina).  Your health care provider may monitor your contractions (uterine monitoring) and your  baby's heart rate (fetal monitoring).  Your health care provider may perform a physical exam.  Normal labor and delivery is divided into three stages.  After labor is over, you and your baby will be monitored closely until you are ready to  go home. This information is not intended to replace advice given to you by your health care provider. Make sure you discuss any questions you have with your health care provider. Document Revised: 02/18/2017 Document Reviewed: 02/18/2017 Elsevier Patient Education  2021 [employer].   Pain Relief During Labor and Delivery Many things can cause pain during labor and delivery, including:  Pressure due to the baby moving through the pelvis.  Stretching of tissues due to the baby moving through the birth canal.  Muscle tension due to anxiety or nervousness.  The uterus tightening (contracting)and relaxing to help move the baby. How do I get pain relief during labor and delivery? Discuss your pain relief options with your health care provider during your prenatal visits. Explore the options offered by your hospital or birth center. There are many ways to deal with the pain of labor and delivery. You can try relaxation techniques or doing relaxing activities, taking a warm shower or bath (hydrotherapy), or other methods. There are also many medicines available to help control pain. Relaxation techniques and activities Practice relaxation techniques or do relaxing activities, such as:  Focused breathing.  Meditation.  Visualization.  Aroma therapy.  Listening to your favorite music.  Hypnosis. Hydrotherapy Take a warm shower or bath. This may:  Provide comfort and relaxation.  Lessen your feeling of pain.  Reduce the amount of pain medicine needed.  Shorten the length of labor. Other methods Try doing other things, such as:  Getting a massage or having counterpressure on your back.  Applying warm packs or ice packs.  Changing  positions often, moving around, or using a birthing ball. Medicines You may be given:  Pain medicine through an IV or an injection into a muscle.  Pain medicine inserted into your spinal column.  Injections of sterile water just under the skin on your lower back.  Nitrous oxide inhalation therapy, also called laughing gas.   What kinds of medicine are available for pain relief? There are two kinds of medicines that can be used to relieve pain during labor and delivery:  Analgesics. These medicines decrease pain without causing you to lose feeling or the ability to move your muscles.  Anesthetics. These medicines block feeling in the body and can decrease your ability to move freely. Both kinds of medicine can cause minor side effects, such as nausea, trouble concentrating, and sleepiness. They can also affect the baby's heart rate before birth and his or her breathing after birth. For this reason, health care providers are careful about when and how much medicine is given. Which medicines are used to provide pain relief? Common medicines The most common medicines used to help manage pain during labor and delivery include:  Opioids. Opioids are medicines that decrease how much pain you feel (perception of pain). These medicines can be given through an IV or may be used with anesthetics to block pain.  Epidural analgesia. ? Epidural analgesia is given through a very thin tube that is inserted into the lower back. Medicine is delivered continuously to the area near your spinal column nerves (epidural space). After having this treatment, you may be able to move your legs, but you will not be able to walk. Depending on the amount and type of medicine given, you may lose all feeling in the lower half of your body, or you may have some sensation, including the urge to push. This treatment can be used to give pain relief for a vaginal birth. ? Sometimes, a  numbing medicine is injected into the  spinal fluid when an epidural catheter is placed. This provides for immediate relief but only lasts for 1-2 hours. Once it wears off, the epidural will provide pain relief. This is called a combined spinal-epidural (CSE) block.  Intrathecal analgesia (spinal analgesia). Intrathecal analgesia is similar to epidural analgesia, but the medicine is injected into the spinal fluid instead of the epidural space. It is usually only given once. It starts to relieve pain quickly, but the pain relief lasts only 1-2 hours.  Pudendal block. This block is done by injecting numbing medicine through the wall of the vagina and into a nerve in the pelvis. Other medicines Other medicines used to help manage pain during labor and delivery include:  Local anesthetics. These are used to numb a small area of the body. They may be used along with another kind of medicine or used to numb the nerves of the vagina, cervix, and perineum during the second stage of labor.  Spinal block (spinal anesthesia). Spinal anesthesia is similar to spinal analgesia, but the medicine that is used contains longer-acting numbing medicines and pain medicines. This type of anesthesia can be used for a cesarean delivery and allows you to stay awake for the birth of your baby.  General anesthetics cause you to lose consciousness so you do not feel pain. They are usually only used for an emergency cesarean delivery. These medicines are given through an IV or a mask or both. These medicines are used as part of a procedure or for an emergency delivery. Summary  Women have many options to help them manage the pain associated with labor and delivery.  You can try doing relaxing activities, taking a warm shower or bath, or other methods.  There are also many medicines available to help control pain during labor and delivery.  Talk with your health care provider about what options are available to you. This information is not intended to replace  advice given to you by your health care provider. Make sure you discuss any questions you have with your health care provider. Document Revised: 12/02/2018 Document Reviewed: 12/02/2018 Elsevier Patient Education  2021 [employer].

## 2020-06-14 NOTE — Progress Notes (Signed)
Routine Prenatal Care Visit  Subjective  Vanessa Thornton is a 25 y.o. G1P0000 at [redacted]w[redacted]d being seen today for ongoing prenatal care.  She is currently monitored for the following issues for this low-risk pregnancy and has Family history of ovarian cancer; Supervision of high risk pregnancy, antepartum; and Obesity affecting pregnancy in first trimester on their problem list.  ----------------------------------------------------------------------------------- Patient reports no complaints.   Contractions: Not present. Vag. Bleeding: None.  Movement: Present. Denies leaking of fluid.  ----------------------------------------------------------------------------------- The following portions of the patient's history were reviewed and updated as appropriate: allergies, current medications, past family history, past medical history, past social history, past surgical history and problem list. Problem list updated.   Objective  Blood pressure 128/72, height 5\' 6"  (1.676 m), weight 231 lb (104.8 kg), last menstrual period 11/28/2019. Pregravid weight 194 lb (88 kg) Total Weight Gain 37 lb (16.8 kg) Urinalysis:      Fetal Status: Fetal Heart Rate (bpm): 150 Fundal Height: 28 cm Movement: Present     General:  Alert, oriented and cooperative. Patient is in no acute distress.  Skin: Skin is warm and dry. No rash noted.   Cardiovascular: Normal heart rate noted  Respiratory: Normal respiratory effort, no problems with respiration noted  Abdomen: Soft, gravid, appropriate for gestational age. Pain/Pressure: Absent     Pelvic:  Cervical exam deferred        Extremities: Normal range of motion.  Edema: None  Mental Status: Normal mood and affect. Normal behavior. Normal judgment and thought content.     Assessment   25 y.o. G1P0000 at [redacted]w[redacted]d by  09/03/2020, by Last Menstrual Period presenting for routine prenatal visit  Plan   FIRST Problems (from 01/07/20 to present)    Problem Noted Resolved    Supervision of high risk pregnancy, antepartum 01/07/2020 by 14/10/2019, CNM No   Overview Addendum 06/14/2020  3:41 PM by 06/16/2020, MD     Nursing Staff Provider  Office Location  [facility] Dating   LMP = 6 wk Natale Milch  Language  English Anatomy US  Normal anatomy, female  Flu Vaccine   Genetic Screen  NIPS:  Neg x 3 - XX   TDaP vaccine    Hgb A1C or  GTT Early : 102 Third trimester :   Rhogam   not needed   LAB RESULTS   Feeding Plan  Breast Blood Type O/Positive/-- (12/10 0911)   Contraception  Condoms Antibody Negative (12/10 0911)  Circumcision n/a Rubella 2.25 (12/10 0911)  Pediatrician   RPR Non Reactive (12/10 0911)   Support Person Lexani Corona HBsAg Negative (12/10 0911)   Prenatal Classes Discussed HIV Non Reactive (12/10 0911)    Varicella Non-immune  BTL Consent n/a GBS  (For PCN allergy, check sensitivities)        VBAC Consent n/a Pap  NIL 2021    Hgb Electro    COVID vaccinated CF  negative     SMA  negative              Previous Version   Obesity affecting pregnancy in first trimester 01/07/2020 by 14/10/2019, CNM No   Overview Signed 01/07/2020  9:27 AM by 14/10/2019, CNM    Family hx of DM (mother with DM and GDM). Patient reports 40lb weight loss in last year. Offered early 1h GTT.          Gestational age appropriate obstetric precautions including but not limited to vaginal bleeding, contractions, leaking  of fluid and fetal movement were reviewed in detail with the patient.    Return in about 2 weeks (around 06/28/2020) for ROB in person.  Natale Milch MD [facility], [facility] 06/14/2020, 3:41 PM

## 2020-06-15 LAB — 28 WEEK RH+PANEL
Basophils Absolute: 0 10*3/uL (ref 0.0–0.2)
Basos: 0 %
EOS (ABSOLUTE): 0.1 10*3/uL (ref 0.0–0.4)
Eos: 1 %
Gestational Diabetes Screen: 120 mg/dL (ref 65–139)
HIV Screen 4th Generation wRfx: NONREACTIVE
Hematocrit: 35.4 % (ref 34.0–46.6)
Hemoglobin: 11.5 g/dL (ref 11.1–15.9)
Immature Grans (Abs): 0.1 10*3/uL (ref 0.0–0.1)
Immature Granulocytes: 1 %
Lymphocytes Absolute: 1.1 10*3/uL (ref 0.7–3.1)
Lymphs: 15 %
MCH: 28.7 pg (ref 26.6–33.0)
MCHC: 32.5 g/dL (ref 31.5–35.7)
MCV: 88 fL (ref 79–97)
Monocytes Absolute: 0.4 10*3/uL (ref 0.1–0.9)
Monocytes: 5 %
Neutrophils Absolute: 5.7 10*3/uL (ref 1.4–7.0)
Neutrophils: 78 %
Platelets: 220 10*3/uL (ref 150–450)
RBC: 4.01 x10E6/uL (ref 3.77–5.28)
RDW: 13.5 % (ref 11.7–15.4)
RPR Ser Ql: NONREACTIVE
WBC: 7.3 10*3/uL (ref 3.4–10.8)

## 2020-06-22 ENCOUNTER — Observation Stay
Admission: EM | Admit: 2020-06-22 | Discharge: 2020-06-22 | Disposition: A | Discharge status: Home / Self Care | Payer: Commercial Managed Care - PPO | Attending: Obstetrics and Gynecology | Admitting: Obstetrics and Gynecology

## 2020-06-22 ENCOUNTER — Encounter: Payer: Self-pay | Admitting: Obstetrics and Gynecology

## 2020-06-22 DIAGNOSIS — O368131 Decreased fetal movements, third trimester, fetus 1: Principal | ICD-10-CM | POA: Insufficient documentation

## 2020-06-22 DIAGNOSIS — O368130 Decreased fetal movements, third trimester, not applicable or unspecified: Secondary | ICD-10-CM | POA: Diagnosis not present

## 2020-06-22 DIAGNOSIS — R109 Unspecified abdominal pain: Secondary | ICD-10-CM

## 2020-06-22 DIAGNOSIS — M549 Dorsalgia, unspecified: Secondary | ICD-10-CM

## 2020-06-22 DIAGNOSIS — Z87891 Personal history of nicotine dependence: Secondary | ICD-10-CM | POA: Diagnosis not present

## 2020-06-22 DIAGNOSIS — O99891 Other specified diseases and conditions complicating pregnancy: Secondary | ICD-10-CM | POA: Diagnosis not present

## 2020-06-22 DIAGNOSIS — Z3A29 29 weeks gestation of pregnancy: Secondary | ICD-10-CM | POA: Diagnosis not present

## 2020-06-22 NOTE — OB Triage Note (Signed)
Pt denies VB or discharge. Denies urinary sx and no recent intercourse. Pt and husband very anxious about possibility of labor.

## 2020-06-22 NOTE — Discharge Summary (Signed)
Physician Final Progress Note  Patient ID: Vanessa Thornton MRN: [id number] DOB/AGE: Dec 13, 1995 25 y.o.  Admit date: 06/22/2020 Admitting provider: Tresea Mall, CNM Discharge date: 06/22/2020   Admission Diagnoses: decreased fetal movement, lower right side pain  Discharge Diagnoses:  Active Problems:   Labor and delivery, indication for care   History of Present Illness: The patient is a 25 y.o. female G1P0000 at [redacted]w[redacted]d who presents for decreased fetal movement and pain in her right side with some radiation to her back that is constant. She denies vaginal bleeding or leakage of fluid. She was admitted for observation and placed on monitors. RN gave her a pitcher of water to drink. Patient was reassured by monitoring and began to feel better with hydration. She was discharged to home with instructions and precautions.   Past Medical History:  Diagnosis Date  . Anxiety   . Depression   . PVC (premature ventricular contraction)     Past Surgical History:  Procedure Laterality Date  . NO PAST SURGERIES      No current facility-administered medications on file prior to encounter.   Current Outpatient Medications on File Prior to Encounter  Medication Sig Dispense Refill  . citalopram (CELEXA) 20 MG tablet Take by mouth.    Marland Kitchen LORazepam (ATIVAN) 0.5 MG tablet  (Patient not taking: No sig reported)    . Prenatal Vit-Fe Fumarate-FA (MULTIVITAMIN-PRENATAL) 27-0.8 MG TABS tablet Take 1 tablet by mouth daily at 12 noon.      Allergies  Allergen Reactions  . Doxycycline     Other reaction(s): Other (See Comments) Bad vaginal yeast infection  . Penicillins Hives and Rash    Has patient had a PCN reaction causing immediate rash, facial/tongue/throat swelling, SOB or lightheadedness with hypotension: Yes Has patient had a PCN reaction causing severe rash involving mucus membranes or skin necrosis: No Has patient had a PCN reaction that required hospitalization No Has patient had a  PCN reaction occurring within the last 10 years: Yes If all of the above answers are "NO", then may proceed with Cephalosporin use.     Social History   Socioeconomic History  . Marital status: Married    Spouse name: Not on file  . Number of children: Not on file  . Years of education: Not on file  . Highest education level: Not on file  Occupational History  . Not on file  Tobacco Use  . Smoking status: Former Smoker    Years: 2.00  . Smokeless tobacco: Current User  Vaping Use  . Vaping Use: Former  Substance and Sexual Activity  . Alcohol use: No    Comment: occassionally  . Drug use: No  . Sexual activity: Yes    Partners: Male    Birth control/protection: None  Other Topics Concern  . Not on file  Social History Narrative  . Not on file   Social Determinants of Health   Financial Resource Strain: Not on file  Food Insecurity: Not on file  Transportation Needs: Not on file  Physical Activity: Not on file  Stress: Not on file  Social Connections: Not on file  Intimate Partner Violence: Not on file    Family History  Problem Relation Age of Onset  . Heart attack Paternal Grandfather   . Ovarian cancer Maternal Aunt        50s     Review of Systems  Constitutional: Negative for chills and fever.  HENT: Negative for congestion, ear discharge, ear pain, hearing loss,  sinus pain and sore throat.   Eyes: Negative for blurred vision and double vision.  Respiratory: Negative for cough, shortness of breath and wheezing.   Cardiovascular: Negative for chest pain, palpitations and leg swelling.  Gastrointestinal: Positive for abdominal pain. Negative for blood in stool, constipation, diarrhea, heartburn, melena, nausea and vomiting.  Genitourinary: Negative for dysuria, flank pain, frequency, hematuria and urgency.  Musculoskeletal: Positive for back pain. Negative for joint pain and myalgias.  Skin: Negative for itching and rash.  Neurological: Negative for  dizziness, tingling, tremors, sensory change, speech change, focal weakness, seizures, loss of consciousness, weakness and headaches.  Endo/Heme/Allergies: Negative for environmental allergies. Does not bruise/bleed easily.  Psychiatric/Behavioral: Negative for depression, hallucinations, memory loss, substance abuse and suicidal ideas. The patient is not nervous/anxious and does not have insomnia.      Physical Exam: BP 134/66 (BP Location: Right Arm)   Pulse 91   Temp 98.1 F (36.7 C)   Resp 16   Ht 5\' 6"  (1.676 m)   Wt 104.8 kg   LMP 11/28/2019   BMI 37.29 kg/m   Constitutional: Well nourished, well developed female in no acute distress.  HEENT: normal Skin: Warm and dry.  Cardiovascular: Regular rate and rhythm.   Respiratory: Clear to auscultation bilateral. Normal respiratory effort Abdomen: FHT present Back: no CVAT Neuro: DTRs 2+, Cranial nerves grossly intact Psych: Alert and Oriented x3. No memory deficits. Normal mood and affect.  MS: normal gait, normal bilateral lower extremity ROM/strength/stability.  Toco: irritability noted Fetal well being: 135 bpm baseline, moderate variability, + 10x10 accelerations, -decelerations  Consults: None  Significant Findings/ Diagnostic Studies: none  Procedures: NST  Hospital Course: The patient was admitted to Labor and Delivery Triage for observation.   Discharge Condition: good  Disposition: Discharge disposition: 01-Home or Self Care  Diet: Regular diet, stay well hydrated  Discharge Activity: Activity as tolerated  Discharge Instructions    Discharge activity:  No Restrictions   Complete by: As directed    Discharge diet:  No restrictions   Complete by: As directed    No sexual activity restrictions   Complete by: As directed    Notify physician for a general feeling that "something is not right"   Complete by: As directed    Notify physician for increase or change in vaginal discharge   Complete by: As  directed    Notify physician for intestinal cramps, with or without diarrhea, sometimes described as "gas pain"   Complete by: As directed    Notify physician for leaking of fluid   Complete by: As directed    Notify physician for low, dull backache, unrelieved by heat or Tylenol   Complete by: As directed    Notify physician for menstrual like cramps   Complete by: As directed    Notify physician for pelvic pressure   Complete by: As directed    Notify physician for uterine contractions.  These may be painless and feel like the uterus is tightening or the baby is  "balling up"   Complete by: As directed    Notify physician for vaginal bleeding   Complete by: As directed    PRETERM LABOR:  Includes any of the follwing symptoms that occur between 20 - [redacted] weeks gestation.  If these symptoms are not stopped, preterm labor can result in preterm delivery, placing your baby at risk   Complete by: As directed      Allergies as of 06/22/2020  Reactions   Doxycycline    Other reaction(s): Other (See Comments) Bad vaginal yeast infection   Penicillins Hives, Rash   Has patient had a PCN reaction causing immediate rash, facial/tongue/throat swelling, SOB or lightheadedness with hypotension: Yes Has patient had a PCN reaction causing severe rash involving mucus membranes or skin necrosis: No Has patient had a PCN reaction that required hospitalization No Has patient had a PCN reaction occurring within the last 10 years: Yes If all of the above answers are "NO", then may proceed with Cephalosporin use.      Medication List    STOP taking these medications   LORazepam 0.5 MG tablet Commonly known as: ATIVAN     TAKE these medications   citalopram 20 MG tablet Commonly known as: CELEXA Take by mouth.   multivitamin-prenatal 27-0.8 MG Tabs tablet Take 1 tablet by mouth daily at 12 noon.       Follow-up Information    [facility] Follow up.   Specialty: Obstetrics and  Gynecology Contact information: [address] [place] [address] [phone]              Total time spent taking care of this patient: 18 minutes  Signed: Tresea Mall, CNM  06/22/2020, 5:22 AM

## 2020-06-22 NOTE — OB Triage Note (Signed)
Pt is G1p0 at 29.4weeks with c/o decreased fetal movement and pain to RLQ side. Pain is constant but at times it is stronger awakening from sleep. Pt reports that she does now feel the baby move and + FM noted. FHR 139

## 2020-06-28 ENCOUNTER — Ambulatory Visit (INDEPENDENT_AMBULATORY_CARE_PROVIDER_SITE_OTHER): Payer: Commercial Managed Care - PPO | Admitting: Obstetrics

## 2020-06-28 ENCOUNTER — Other Ambulatory Visit: Payer: Self-pay

## 2020-06-28 ENCOUNTER — Encounter: Payer: Self-pay | Admitting: Obstetrics

## 2020-06-28 VITALS — BP 120/70 | Ht 66.0 in | Wt 232.6 lb

## 2020-06-28 DIAGNOSIS — Z3A30 30 weeks gestation of pregnancy: Secondary | ICD-10-CM

## 2020-06-28 DIAGNOSIS — Z23 Encounter for immunization: Secondary | ICD-10-CM

## 2020-06-28 DIAGNOSIS — O0993 Supervision of high risk pregnancy, unspecified, third trimester: Secondary | ICD-10-CM

## 2020-06-28 DIAGNOSIS — O99213 Obesity complicating pregnancy, third trimester: Secondary | ICD-10-CM

## 2020-06-28 LAB — POCT URINALYSIS DIPSTICK OB
Glucose, UA: NEGATIVE
POC,PROTEIN,UA: NEGATIVE

## 2020-06-28 NOTE — Progress Notes (Signed)
Routine Prenatal Care Visit  Subjective  Vanessa Thornton is a 25 y.o. G1P0000 at [redacted]w[redacted]d being seen today for ongoing prenatal care.  She is currently monitored for the following issues for this high-risk pregnancy and has Family history of ovarian cancer; Supervision of high risk pregnancy, antepartum; Obesity affecting pregnancy in first trimester; and Decreased fetal movements in third trimester on their problem list.  ----------------------------------------------------------------------------------- Patient reports no complaints.   Contractions: Not present. Vag. Bleeding: None.  Movement: Present. Leaking Fluid denies.  ----------------------------------------------------------------------------------- The following portions of the patient's history were reviewed and updated as appropriate: allergies, current medications, past family history, past medical history, past social history, past surgical history and problem list. Problem list updated.  Objective  Blood pressure 120/70, height 5\' 6"  (1.676 m), weight 232 lb 9.6 oz (105.5 kg), last menstrual period 11/28/2019. Pregravid weight 194 lb (88 kg) Total Weight Gain 38 lb 9.6 oz (17.5 kg) Urinalysis: Urine Protein    Urine Glucose    Fetal Status:     Movement: Present     General:  Alert, oriented and cooperative. Patient is in no acute distress.  Skin: Skin is warm and dry. No rash noted.   Cardiovascular: Normal heart rate noted  Respiratory: Normal respiratory effort, no problems with respiration noted  Abdomen: Soft, gravid, appropriate for gestational age. Pain/Pressure: Absent     Pelvic:  Cervical exam deferred        Extremities: Normal range of motion.     Mental Status: Normal mood and affect. Normal behavior. Normal judgment and thought content.   Assessment   25 y.o. G1P0000 at [redacted]w[redacted]d by  09/03/2020, by Last Menstrual Period presenting for routine prenatal visit  Plan   FIRST Problems (from 01/07/20 to present)     Problem Noted Resolved   Supervision of high risk pregnancy, antepartum 01/07/2020 by 14/10/2019, CNM No   Overview Addendum 06/14/2020  3:41 PM by 06/16/2020, MD     Nursing Staff Provider  Office Location  [facility] Dating   LMP = 6 wk Natale Milch  Language  English Anatomy US  Normal anatomy, female  Flu Vaccine   Genetic Screen  NIPS:  Neg x 3 - XX   TDaP vaccine    Hgb A1C or  GTT Early : 102 Third trimester :   Rhogam   not needed   LAB RESULTS   Feeding Plan  Breast Blood Type O/Positive/-- (12/10 0911)   Contraception  Condoms Antibody Negative (12/10 0911)  Circumcision n/a Rubella 2.25 (12/10 0911)  Pediatrician   RPR Non Reactive (12/10 0911)   Support Person Victora Irby HBsAg Negative (12/10 0911)   Prenatal Classes Discussed HIV Non Reactive (12/10 0911)    Varicella Non-immune  BTL Consent n/a GBS  (For PCN allergy, check sensitivities)        VBAC Consent n/a Pap  NIL 2021    Hgb Electro    COVID vaccinated CF  negative     SMA  negative              Previous Version   Obesity affecting pregnancy in first trimester 01/07/2020 by 14/10/2019, CNM No   Overview Addendum 06/28/2020  4:53 PM by 08/28/2020, CNM    Family hx of DM (mother with DM and GDM). Patient reports 40lb weight loss in last year. Offered early 1h GTT.  Daily ASA 14 weeks on.      Previous Version  Preterm labor symptoms and general obstetric precautions including but not limited to vaginal bleeding, contractions, leaking of fluid and fetal movement were reviewed in detail with the patient. Please refer to After Visit Summary for other counseling recommendations.  TDAP given today.   Return in about 2 weeks (around 07/12/2020) for return OB.  Mirna Mires, CNM  06/28/2020 4:53 PM

## 2020-06-30 ENCOUNTER — Other Ambulatory Visit: Payer: Self-pay | Admitting: Obstetrics

## 2020-06-30 DIAGNOSIS — Z3689 Encounter for other specified antenatal screening: Secondary | ICD-10-CM

## 2020-07-11 ENCOUNTER — Other Ambulatory Visit: Payer: Self-pay

## 2020-07-11 ENCOUNTER — Ambulatory Visit (INDEPENDENT_AMBULATORY_CARE_PROVIDER_SITE_OTHER): Payer: Commercial Managed Care - PPO | Admitting: Obstetrics

## 2020-07-11 VITALS — BP 110/60 | Wt 235.0 lb

## 2020-07-11 DIAGNOSIS — O0993 Supervision of high risk pregnancy, unspecified, third trimester: Secondary | ICD-10-CM

## 2020-07-11 DIAGNOSIS — Z3A32 32 weeks gestation of pregnancy: Secondary | ICD-10-CM

## 2020-07-11 LAB — POCT URINALYSIS DIPSTICK OB
Glucose, UA: NEGATIVE
POC,PROTEIN,UA: NEGATIVE

## 2020-07-11 NOTE — Progress Notes (Signed)
ROB- no concerns 

## 2020-07-11 NOTE — Progress Notes (Signed)
Routine Prenatal Care Visit  Subjective  Vanessa Thornton is a 25 y.o. G1P0000 at [redacted]w[redacted]d being seen today for ongoing prenatal care.  She is currently monitored for the following issues for this high-risk pregnancy and has Family history of ovarian cancer; Supervision of high risk pregnancy, antepartum; Obesity affecting pregnancy in first trimester; and Decreased fetal movements in third trimester on their problem list.  ----------------------------------------------------------------------------------- Patient reports no complaints.  She does have Braxton Hicks contractions now that she notices.  .  .   [name] denies.  ----------------------------------------------------------------------------------- The following portions of the patient's history were reviewed and updated as appropriate: allergies, current medications, past family history, past medical history, past social history, past surgical history and problem list. Problem list updated.  Objective  Blood pressure 110/60, weight 235 lb (106.6 kg), last menstrual period 11/28/2019. Pregravid weight 194 lb (88 kg) Total Weight Gain 41 lb (18.6 kg) Urinalysis: Urine Protein Negative  Urine Glucose Negative  Fetal Status:           General:  Alert, oriented and cooperative. Patient is in no acute distress.  Skin: Skin is warm and dry. No rash noted.   Cardiovascular: Normal heart rate noted  Respiratory: Normal respiratory effort, no problems with respiration noted  Abdomen: Soft, gravid, appropriate for gestational age.       Pelvic:  Cervical exam deferred        Extremities: Normal range of motion.     Mental Status: Normal mood and affect. Normal behavior. Normal judgment and thought content.   Assessment   25 y.o. G1P0000 at [redacted]w[redacted]d by  09/03/2020, by Last Menstrual Period presenting for routine prenatal visit  Plan   FIRST Problems (from 01/07/20 to present)    Problem Noted Resolved   Supervision of high risk  pregnancy, antepartum 01/07/2020 by Zipporah Plants, CNM No   Overview Addendum 06/14/2020  3:41 PM by Natale Milch, MD     Nursing Staff Provider  Office Location  [facility] Dating   LMP = 6 wk [place]  Language  English Anatomy US  Normal anatomy, female  Flu Vaccine   Genetic Screen  NIPS:  Neg x 3 - XX   TDaP vaccine    Hgb A1C or  GTT Early : 102 Third trimester :   Rhogam   not needed   LAB RESULTS   Feeding Plan  Breast Blood Type O/Positive/-- (12/10 0911)   Contraception  Condoms Antibody Negative (12/10 0911)  Circumcision n/a Rubella 2.25 (12/10 0911)  Pediatrician   RPR Non Reactive (12/10 0911)   Support Person Takeshia Wenk HBsAg Negative (12/10 0911)   Prenatal Classes Discussed HIV Non Reactive (12/10 0911)    Varicella Non-immune  BTL Consent n/a GBS  (For PCN allergy, check sensitivities)        VBAC Consent n/a Pap  NIL 2021    Hgb Electro    COVID vaccinated CF  negative     SMA  negative               Obesity affecting pregnancy in first trimester 01/07/2020 by Zipporah Plants, CNM No   Overview Addendum 06/28/2020  4:53 PM by Mirna Mires, CNM    Family hx of DM (mother with DM and GDM). Patient reports 40lb weight loss in last year. Offered early 1h GTT.  Daily ASA 14 weeks on.           Preterm labor symptoms and general obstetric precautions including but  not limited to vaginal bleeding, contractions, leaking of fluid and fetal movement were reviewed in detail with the patient. Please refer to After Visit Summary for other counseling recommendations.   Return in about 2 weeks (around 07/25/2020) for return OB.  Mirna Mires, CNM  07/11/2020 5:22 PM

## 2020-07-20 ENCOUNTER — Encounter: Payer: Commercial Managed Care - PPO | Admitting: Obstetrics and Gynecology

## 2020-07-28 ENCOUNTER — Ambulatory Visit (INDEPENDENT_AMBULATORY_CARE_PROVIDER_SITE_OTHER): Payer: Commercial Managed Care - PPO | Admitting: Obstetrics

## 2020-07-28 ENCOUNTER — Other Ambulatory Visit: Payer: Self-pay

## 2020-07-28 VITALS — BP 120/80 | Wt 240.0 lb

## 2020-07-28 DIAGNOSIS — O0993 Supervision of high risk pregnancy, unspecified, third trimester: Secondary | ICD-10-CM

## 2020-07-28 DIAGNOSIS — Z3A34 34 weeks gestation of pregnancy: Secondary | ICD-10-CM

## 2020-07-28 NOTE — Progress Notes (Signed)
Routine Prenatal Care Visit  Subjective  Vanessa Thornton is a 25 y.o. G1P0000 at [redacted]w[redacted]d being seen today for ongoing prenatal care.  She is currently monitored for the following issues for this high-risk pregnancy and has Family history of ovarian cancer; Supervision of high risk pregnancy, antepartum; Obesity affecting pregnancy in first trimester; and Decreased fetal movements in third trimester on their problem list.  ----------------------------------------------------------------------------------- Patient reports no complaints.  She does have some difficulty getting to sleep at night.  Contractions: Not present. Vag. Bleeding: None.  Movement: Present. Leaking Fluid denies.  ----------------------------------------------------------------------------------- The following portions of the patient's history were reviewed and updated as appropriate: allergies, current medications, past family history, past medical history, past social history, past surgical history and problem list. Problem list updated.  Objective  Blood pressure 120/80, weight 240 lb (108.9 kg), last menstrual period 11/28/2019. Pregravid weight 194 lb (88 kg) Total Weight Gain 46 lb (20.9 kg) Urinalysis: Urine Protein    Urine Glucose    Fetal Status:     Movement: Present     General:  Alert, oriented and cooperative. Patient is in no acute distress.  Skin: Skin is warm and dry. No rash noted.   Cardiovascular: Normal heart rate noted  Respiratory: Normal respiratory effort, no problems with respiration noted  Abdomen: Soft, gravid, appropriate for gestational age. Pain/Pressure: Present     Pelvic:  Cervical exam deferred        Extremities: Normal range of motion.  Edema: None  Mental Status: Normal mood and affect. Normal behavior. Normal judgment and thought content.   Assessment   25 y.o. G1P0000 at [redacted]w[redacted]d by  09/03/2020, by Last Menstrual Period presenting for routine prenatal visit  Plan   FIRST Problems  (from 01/07/20 to present)    Problem Noted Resolved   Supervision of high risk pregnancy, antepartum 01/07/2020 by Zipporah Plants, CNM No   Overview Addendum 06/14/2020  3:41 PM by Natale Milch, MD     Nursing Staff Provider  Office Location  [facility] Dating   LMP = 6 wk [place]  Language  English Anatomy US  Normal anatomy, female  Flu Vaccine   Genetic Screen  NIPS:  Neg x 3 - XX   TDaP vaccine    Hgb A1C or  GTT Early : 102 Third trimester :   Rhogam   not needed   LAB RESULTS   Feeding Plan  Breast Blood Type O/Positive/-- (12/10 0911)   Contraception  Condoms Antibody Negative (12/10 0911)  Circumcision n/a Rubella 2.25 (12/10 0911)  Pediatrician   RPR Non Reactive (12/10 0911)   Support Person Sydnie Sigmund HBsAg Negative (12/10 0911)   Prenatal Classes Discussed HIV Non Reactive (12/10 0911)    Varicella Non-immune  BTL Consent n/a GBS  (For PCN allergy, check sensitivities)        VBAC Consent n/a Pap  NIL 2021    Hgb Electro    COVID vaccinated CF  negative     SMA  negative               Obesity affecting pregnancy in first trimester 01/07/2020 by Zipporah Plants, CNM No   Overview Addendum 06/28/2020  4:53 PM by Mirna Mires, CNM    Family hx of DM (mother with DM and GDM). Patient reports 40lb weight loss in last year. Offered early 1h GTT.  Daily ASA 14 weeks on.           Preterm labor symptoms and  general obstetric precautions including but not limited to vaginal bleeding, contractions, leaking of fluid and fetal movement were reviewed in detail with the patient. Please refer to After Visit Summary for other counseling recommendations.  She is expressing a desire for IOL with MMF. Discussed elective IOL  Return in about 2 weeks (around 08/11/2020) for return OB, GBS lab.  Mirna Mires, CNM  07/28/2020 5:23 PM

## 2020-08-01 ENCOUNTER — Ambulatory Visit: Payer: Commercial Managed Care - PPO

## 2020-08-08 ENCOUNTER — Other Ambulatory Visit: Payer: Self-pay

## 2020-08-08 ENCOUNTER — Ambulatory Visit: Payer: Commercial Managed Care - PPO | Attending: Obstetrics and Gynecology

## 2020-08-08 ENCOUNTER — Other Ambulatory Visit: Payer: Self-pay | Admitting: Obstetrics

## 2020-08-08 ENCOUNTER — Ambulatory Visit: Payer: Commercial Managed Care - PPO

## 2020-08-08 DIAGNOSIS — O99213 Obesity complicating pregnancy, third trimester: Secondary | ICD-10-CM

## 2020-08-08 DIAGNOSIS — O368130 Decreased fetal movements, third trimester, not applicable or unspecified: Secondary | ICD-10-CM | POA: Insufficient documentation

## 2020-08-08 DIAGNOSIS — E669 Obesity, unspecified: Secondary | ICD-10-CM

## 2020-08-08 DIAGNOSIS — Z3689 Encounter for other specified antenatal screening: Secondary | ICD-10-CM

## 2020-08-08 DIAGNOSIS — Z3A36 36 weeks gestation of pregnancy: Secondary | ICD-10-CM | POA: Insufficient documentation

## 2020-08-08 NOTE — Progress Notes (Signed)
Growth sono at 36 weeks shows 94% growth. The patient may likely desire an elective IOL. Mirna Mires, CNM  08/08/2020 5:39 PM

## 2020-08-11 ENCOUNTER — Ambulatory Visit (INDEPENDENT_AMBULATORY_CARE_PROVIDER_SITE_OTHER): Payer: Commercial Managed Care - PPO | Admitting: Obstetrics

## 2020-08-11 ENCOUNTER — Other Ambulatory Visit: Payer: Self-pay

## 2020-08-11 VITALS — BP 122/78 | Wt 244.0 lb

## 2020-08-11 DIAGNOSIS — O0990 Supervision of high risk pregnancy, unspecified, unspecified trimester: Secondary | ICD-10-CM

## 2020-08-11 DIAGNOSIS — Z3A36 36 weeks gestation of pregnancy: Secondary | ICD-10-CM

## 2020-08-11 LAB — POCT URINALYSIS DIPSTICK OB
Glucose, UA: NEGATIVE
POC,PROTEIN,UA: NEGATIVE

## 2020-08-11 NOTE — Progress Notes (Signed)
Routine Prenatal Care Visit  Subjective  Vanessa Thornton is a 25 y.o. G1P0000 at [redacted]w[redacted]d being seen today for ongoing prenatal care.  She is currently monitored for the following issues for this high-risk pregnancy and has Family history of ovarian cancer; Supervision of high risk pregnancy, antepartum; Obesity affecting pregnancy in first trimester; and Decreased fetal movements in third trimester on their problem list.  ----------------------------------------------------------------------------------- Patient reports no complaints.    .  .   [name] denies.  ----------------------------------------------------------------------------------- The following portions of the patient's history were reviewed and updated as appropriate: allergies, current medications, past family history, past medical history, past social history, past surgical history and problem list. Problem list updated.  Objective  Blood pressure 122/78, weight 244 lb (110.7 kg), last menstrual period 11/28/2019. Pregravid weight 194 lb (88 kg) Total Weight Gain 50 lb (22.7 kg) Urinalysis: Urine Protein Negative  Urine Glucose Negative  Fetal Status:           General:  Alert, oriented and cooperative. Patient is in no acute distress.  Skin: Skin is warm and dry. No rash noted.   Cardiovascular: Normal heart rate noted  Respiratory: Normal respiratory effort, no problems with respiration noted  Abdomen: Soft, gravid, appropriate for gestational age.       Pelvic:  Cervical exam performed      anteriori cervix- 1cm/40%/-3  Extremities: Normal range of motion.     Mental Status: Normal mood and affect. Normal behavior. Normal judgment and thought content.   Assessment   25 y.o. G1P0000 at [redacted]w[redacted]d by  09/03/2020, by Last Menstrual Period presenting for routine prenatal visit  Plan   FIRST Problems (from 01/07/20 to present)    Problem Noted Resolved   Supervision of high risk pregnancy, antepartum 01/07/2020 by  Zipporah Plants, CNM No   Overview Addendum 06/14/2020  3:41 PM by Natale Milch, MD     Nursing Staff Provider  Office Location  [facility] Dating   LMP = 6 wk [place]  Language  English Anatomy US  Normal anatomy, female  Flu Vaccine   Genetic Screen  NIPS:  Neg x 3 - XX   TDaP vaccine    Hgb A1C or  GTT Early : 102 Third trimester :   Rhogam   not needed   LAB RESULTS   Feeding Plan  Breast Blood Type O/Positive/-- (12/10 0911)   Contraception  Condoms Antibody Negative (12/10 0911)  Circumcision n/a Rubella 2.25 (12/10 0911)  Pediatrician   RPR Non Reactive (12/10 0911)   Support Person Brightyn Mozer HBsAg Negative (12/10 0911)   Prenatal Classes Discussed HIV Non Reactive (12/10 0911)    Varicella Non-immune  BTL Consent n/a GBS  (For PCN allergy, check sensitivities)        VBAC Consent n/a Pap  NIL 2021    Hgb Electro    COVID vaccinated CF  negative     SMA  negative              Obesity affecting pregnancy in first trimester 01/07/2020 by Zipporah Plants, CNM No   Overview Addendum 06/28/2020  4:53 PM by Mirna Mires, CNM    Family hx of DM (mother with DM and GDM). Patient reports 40lb weight loss in last year. Offered early 1h GTT.  Daily ASA 14 weeks on.          Term labor symptoms and general obstetric precautions including but not limited to vaginal bleeding, contractions, leaking of fluid  and fetal movement were reviewed in detail with the patient. Please refer to After Visit Summary for other counseling recommendations.   Return in about 1 week (around 08/18/2020) for return OB.  Mirna Mires, CNM  08/11/2020 4:51 PM

## 2020-08-11 NOTE — Progress Notes (Signed)
ROB - GBS, no concerns. RM 4 

## 2020-08-16 LAB — STREP GP B SUSCEPTIBILITY

## 2020-08-16 LAB — STREP GP B CULTURE+RFLX: Strep Gp B Culture+Rflx: POSITIVE — AB

## 2020-08-17 ENCOUNTER — Encounter: Payer: Self-pay | Admitting: Advanced Practice Midwife

## 2020-08-17 ENCOUNTER — Ambulatory Visit (INDEPENDENT_AMBULATORY_CARE_PROVIDER_SITE_OTHER): Payer: Commercial Managed Care - PPO | Admitting: Advanced Practice Midwife

## 2020-08-17 ENCOUNTER — Other Ambulatory Visit: Payer: Self-pay

## 2020-08-17 VITALS — BP 124/72 | Wt 245.0 lb

## 2020-08-17 DIAGNOSIS — F32A Depression, unspecified: Secondary | ICD-10-CM

## 2020-08-17 DIAGNOSIS — F419 Anxiety disorder, unspecified: Secondary | ICD-10-CM | POA: Insufficient documentation

## 2020-08-17 DIAGNOSIS — O99343 Other mental disorders complicating pregnancy, third trimester: Secondary | ICD-10-CM

## 2020-08-17 DIAGNOSIS — O99213 Obesity complicating pregnancy, third trimester: Secondary | ICD-10-CM

## 2020-08-17 DIAGNOSIS — Z3A37 37 weeks gestation of pregnancy: Secondary | ICD-10-CM

## 2020-08-17 DIAGNOSIS — O0993 Supervision of high risk pregnancy, unspecified, third trimester: Secondary | ICD-10-CM

## 2020-08-17 LAB — POCT URINALYSIS DIPSTICK OB
Glucose, UA: NEGATIVE
POC,PROTEIN,UA: NEGATIVE

## 2020-08-17 MED ORDER — CITALOPRAM HYDROBROMIDE 20 MG PO TABS: 30.0000 mg | ORAL_TABLET | Freq: Every day | ORAL | 5 refills | Status: DC

## 2020-08-17 NOTE — Progress Notes (Signed)
Routine Prenatal Care Visit  Subjective  Vanessa Thornton is a 25 y.o. G1P0000 at [redacted]w[redacted]d being seen today for ongoing prenatal care.  She is currently monitored for the following issues for this high-risk pregnancy and has Family history of ovarian cancer; Supervision of high risk pregnancy, antepartum; Obesity affecting pregnancy in first trimester; and Decreased fetal movements in third trimester on their problem list.  ----------------------------------------------------------------------------------- Patient reports no complaints.  She desires 39 week induction. Contractions: Irritability. Vag. Bleeding: None.  Movement: Present. Leaking Fluid denies.  ----------------------------------------------------------------------------------- The following portions of the patient's history were reviewed and updated as appropriate: allergies, current medications, past family history, past medical history, past social history, past surgical history and problem list. Problem list updated.  Objective  Blood pressure 124/72, weight 245 lb (111.1 kg), last menstrual period 11/28/2019. Pregravid weight 194 lb (88 kg) Total Weight Gain 51 lb (23.1 kg) Urinalysis: Urine Protein Negative  Urine Glucose Negative  Fetal Status: Fetal Heart Rate (bpm): 146   Movement: Present  Presentation: Vertex  General:  Alert, oriented and cooperative. Patient is in no acute distress.  Skin: Skin is warm and dry. No rash noted.   Cardiovascular: Normal heart rate noted  Respiratory: Normal respiratory effort, no problems with respiration noted  Abdomen: Soft, gravid, appropriate for gestational age. Pain/Pressure: Present     Pelvic:  Cervical exam performed Dilation: 1 Effacement (%): 40 Station: -3  Extremities: Normal range of motion.  Edema: None  Mental Status: Normal mood and affect. Normal behavior. Normal judgment and thought content.   Assessment   25 y.o. G1P0000 at [redacted]w[redacted]d by  09/03/2020, by Last Menstrual  Period presenting for routine prenatal visit  Plan   FIRST Problems (from 01/07/20 to present)    Problem Noted Resolved   Supervision of high risk pregnancy, antepartum 01/07/2020 by Zipporah Plants, CNM No   Overview Addendum 08/16/2020  5:53 PM by Mirna Mires, CNM     Nursing Staff Provider  Office Location  [facility] Dating   LMP = 6 wk [place]  Language  English Anatomy US  Normal anatomy, female  Flu Vaccine   Genetic Screen  NIPS:  Neg x 3 - XX   TDaP vaccine    Hgb A1C or  GTT Early : 102 Third trimester :   Rhogam   not needed   LAB RESULTS   Feeding Plan  Breast Blood Type     Contraception  Condoms Antibody    Circumcision n/a Rubella    Pediatrician   RPR     Support Person [name] HBs[facility]     Prenatal Classes Discussed HIV      Varicella Non-immune  BTL Consent n/a GBS  (For PCN allergy, check sensitivities) GBS positive PCN allergy, clind resistant- so Vancomycin       VBAC Consent n/a Pap  NIL 2021    Hgb Electro    COVID vaccinated CF  negative     SMA  negative              Obesity affecting pregnancy in first trimester 01/07/2020 by Zipporah Plants, CNM No   Overview Addendum 06/28/2020  4:53 PM by Mirna Mires, CNM    Family hx of DM (mother with DM and GDM). Patient reports 40lb weight loss in last year. Offered early 1h GTT.  Daily ASA 14 weeks on.          Term labor symptoms and general obstetric precautions including but not limited to  vaginal bleeding, contractions, leaking of fluid and fetal movement were reviewed in detail with the patient. Please refer to After Visit Summary for other counseling recommendations.  Will schedule IOL today for 39w, place orders and initial H&P  Return in about 1 week (around 08/24/2020) for rob.  Tresea Mall, CNM 08/17/2020 11:09 AM

## 2020-08-17 NOTE — Patient Instructions (Signed)
Vaginal Delivery  Vaginal delivery means that you give birth by pushing your baby out of your birth canal (vagina). Your health care team will help you before, during, and after vaginaldelivery. Birth experiences are unique for every woman and every pregnancy, and birthexperiences vary depending on where you choose to give birth. What are the risks and benefits? Generally, this is safe. However, problems may occur, including: Bleeding. Infection. Damage to other structures such as vaginal tearing. Allergic reactions to medicines. Despite the risks, benefits of vaginal delivery include less risk of bleeding and infection and a shorter recovery time compared to a Cesarean delivery.Cesarean delivery, or C-section, is the surgical delivery of a baby. What happens when I arrive at the birth center or hospital? Once you are in labor and have been admitted into the hospital or birth center, your health care team may: Review your pregnancy history and any concerns that you have. Talk with you about your birth plan and discuss pain control options. Check your blood pressure, breathing, and heartbeat. Assess your baby's heartbeat. Monitor your uterus for contractions. Check whether your bag of water (amniotic sac) has broken (ruptured). Insert an IV into one of your veins. This may be used to give you fluids and medicines. Monitoring Your health care team may assess your contractions (uterine monitoring) and your baby's heart rate (fetal monitoring). You may need to be monitored: Often, but not continuously (intermittently). All the time or for long periods at a time (continuously). Continuous monitoring may be needed if: You are taking certain medicines, such as medicine to relieve pain or make your contractions stronger. You have pregnancy or labor complications. Monitoring may be done by: Placing a special stethoscope or a handheld monitoring device on your abdomen to check your baby's heartbeat  and to check for contractions. Placing monitors on your abdomen (external monitors) to record your baby's heartbeat and the frequency and length of contractions. Placing monitors inside your uterus through your vagina (internal monitors) to record your baby's heartbeat and the frequency, length, and strength of your contractions. Depending on the type of monitor, it may remain in your uterus or on your baby's head until birth. Telemetry. This is a type of continuous monitoring that can be done with external or internal monitors. Instead of having to stay in bed, you are able to move around. Physical exam Your health care team may perform frequent physical exams. This may include: Checking how and where your baby is positioned in your uterus. Checking your cervix to determine: Whether it is thinning out (effacing). Whether it is opening up (dilating). What happens during labor and delivery?  Normal labor and delivery is divided into the following three stages: Stage 1 This is the longest stage of labor. Throughout this stage, you will feel contractions. Contractions generally feel mild, infrequent, and irregular at first. They get stronger, more frequent, and more regular as you move through this stage. You may have contractions about every 2-3 minutes. This stage ends when your cervix is completely dilated to 4 inches (10 cm) and completely effaced. Stage 2 This stage starts once your cervix is completely effaced and dilated and lasts until the delivery of your baby. This is the stage where you will feel an urge to push your baby out of your vagina. You may feel stretching and burning pain, especially when the widest part of your baby's head passes through the vaginal opening (crowning). Once your baby is delivered, the umbilical cord will be clamped and cut.  Timing of cutting the cord will depend on your wishes, your baby's health, and your health care provider's practices. Your baby will be  placed on your bare chest (skin-to-skin contact) in an upright position and covered with a warm blanket. If you are choosing to breastfeed, watch your baby for feeding cues, like rooting or sucking, and help the baby to your breast for his or her first feeding. Stage 3 This stage starts immediately after the birth of your baby and ends after you deliver the placenta. This stage may take anywhere from 5 to 30 minutes. After your baby has been delivered, you will feel contractions as your body expels the placenta. These contractions also help your uterus get smaller and reduce bleeding. What can I expect after labor and delivery? After labor is over, you and your baby will be assessed closely until you are ready to go home. Your health care team will teach you how to care for yourself and your baby. You and your baby may be encouraged to stay in the same room (rooming in) during your hospital stay. This will help promote early bonding and successful breastfeeding. Your uterus will be checked and massaged regularly (fundal massage). You may continue to receive fluids and medicines through an IV. You will have some soreness and pain in your abdomen, vagina, and the area of skin between your vaginal opening and your anus (perineum). If an incision was made near your vagina (episiotomy) or if you had some vaginal tearing during delivery, cold compresses may be placed on your episiotomy or your tear. This helps to reduce pain and swelling. It is normal to have vaginal bleeding after delivery. Wear a sanitary pad for vaginal bleeding and discharge. Summary Vaginal delivery means that you will give birth by pushing your baby out of your birth canal (vagina). Your health care team will monitor you and your baby throughout the stages of labor. After you deliver your baby, your health care team will continue to assess you and your baby to ensure you are both recovering as expected after delivery. This  information is not intended to replace advice given to you by your health care provider. Make sure you discuss any questions you have with your healthcare provider. Document Revised: 12/13/2019 Document Reviewed: 12/13/2019 Elsevier Patient Education  2022 [employer] Pain Relief During Labor and Delivery Many things can cause pain during labor and delivery, including: Pressure due to the baby moving through the pelvis. Stretching of tissues due to the baby moving through the birth canal. Muscle tension due to anxiety or nervousness. The uterus tightening (contracting)and relaxing to help move the baby. How do I get pain relief during labor and delivery?  Discuss your pain relief options with your health care provider during your prenatal visits. Explore the options offered by your hospital or birth center. There are many ways to deal with the pain of labor and delivery. You can try relaxation techniques or doing relaxing activities, taking a warm shower or bath (hydrotherapy), or other methods. There are also many medicines available to help controlpain. Relaxation techniques and activities Practice relaxation techniques or do relaxing activities, such as: Focused breathing. Meditation. Visualization. Aroma therapy. Listening to your favorite music. Hypnosis. Hydrotherapy Take a warm shower or bath. This may: Provide comfort and relaxation. Lessen your feeling of pain. Reduce the amount of pain medicine needed. Shorten the length of labor. Other methods Try doing other things, such as: Getting a massage or having counterpressure on your back.  Applying warm packs or ice packs. Changing positions often, moving around, or using a birthing ball. Medicines You may be given: Pain medicine through an IV or an injection into a muscle. Pain medicine inserted into your spinal column. Injections of sterile water just under the skin on your lower back. Nitrous oxide inhalation therapy,  also called laughing gas. What kinds of medicine are available for pain relief? There are two kinds of medicines that can be used to relieve pain during labor and delivery: Analgesics. These medicines decrease pain without causing you to lose feeling or the ability to move your muscles. Anesthetics. These medicines block feeling in the body and can decrease your ability to move freely. Both kinds of medicine can cause minor side effects, such as nausea, trouble concentrating, and sleepiness. They can also affect the baby's heart rate before birth and his or her breathing after birth. For this reason, health careproviders are careful about when and how much medicine is given. Which medicines are used to provide pain relief? Common medicines The most common medicines used to help manage pain during labor and delivery include: Opioids. Opioids are medicines that decrease how much pain you feel (perception of pain). These medicines can be given through an IV or may be used with anesthetics to block pain. Epidural analgesia. Epidural analgesia is given through a very thin tube that is inserted into the lower back. Medicine is delivered continuously to the area near your spinal column nerves (epidural space). After having this treatment, you may be able to move your legs, but you will not be able to walk. Depending on the amount and type of medicine given, you may lose all feeling in the lower half of your body, or you may have some sensation, including the urge to push. This treatment can be used to give pain relief for a vaginal birth. Sometimes, a numbing medicine is injected into the spinal fluid when an epidural catheter is placed. This provides for immediate relief but only lasts for 1-2 hours. Once it wears off, the epidural will provide pain relief. This is called a combined spinal-epidural (CSE) block. Intrathecal analgesia (spinal analgesia). Intrathecal analgesia is similar to epidural analgesia,  but the medicine is injected into the spinal fluid instead of the epidural space. It is usually only given once. It starts to relieve pain quickly, but the pain relief lasts only 1-2 hours. Pudendal block. This block is done by injecting numbing medicine through the wall of the vagina and into a nerve in the pelvis. Other medicines Other medicines used to help manage pain during labor and delivery include: Local anesthetics. These are used to numb a small area of the body. They may be used along with another kind of medicine or used to numb the nerves of the vagina, cervix, and perineum during the second stage of labor. Spinal block (spinal anesthesia). Spinal anesthesia is similar to spinal analgesia, but the medicine that is used contains longer-acting numbing medicines and pain medicines. This type of anesthesia can be used for a cesarean delivery and allows you to stay awake for the birth of your baby. General anesthetics cause you to lose consciousness so you do not feel pain. They are usually only used for an emergency cesarean delivery. These medicines are given through an IV or a mask or both. These medicines are used as part of a procedure or for an emergency delivery. Summary Women have many options to help them manage the pain associated with labor and  delivery. You can try doing relaxing activities, taking a warm shower or bath, or other methods. There are also many medicines available to help control pain during labor and delivery. Talk with your health care provider about what options are available to you. This information is not intended to replace advice given to you by your health care provider. Make sure you discuss any questions you have with your healthcare provider. Document Revised: 12/02/2018 Document Reviewed: 12/02/2018 Elsevier Patient Education  2022 [employer].

## 2020-08-17 NOTE — H&P (Cosign Needed)
OB History & Physical   History of Present Illness:  Initial H&P: 08/17/2020  Chief Complaint: elective induction of labor  HPI:  Vanessa Thornton is a 25 y.o. G1P0000 female at [redacted]w[redacted]d dated by LMP c/w 7 week ultrasound.  Her pregnancy has been complicated by obesity, GBS positive allergic to penicillin.    She denies contractions.   She denies leakage of fluid.   She denies vaginal bleeding.   She reports fetal movement.    Total weight gain for pregnancy: 51 lb (23.1 kg)   Obstetrical Problem List: FIRST Problems (from 01/07/20 to present)     Problem Noted Resolved   Supervision of high risk pregnancy, antepartum 01/07/2020 by Zipporah Plants, CNM No   Overview Addendum 08/16/2020  5:53 PM by Mirna Mires, CNM     Nursing Staff Provider  Office Location  [facility] Dating   LMP = 6 wk [place]  Language  English Anatomy US  Normal anatomy, female  Flu Vaccine   Genetic Screen  NIPS:  Neg x 3 - XX   TDaP vaccine    Hgb A1C or  GTT Early : 102 Third trimester :   Rhogam   not needed   LAB RESULTS   Feeding Plan  Breast Blood Type     Contraception  Condoms Antibody    Circumcision n/a Rubella    Pediatrician   RPR     Support Person Nashay Brickley HBsAg     Prenatal Classes Discussed HIV      Varicella Non-immune  BTL Consent n/a GBS  (For PCN allergy, check sensitivities) GBS positive PCN allergy, clind resistant- so Vancomycin       VBAC Consent n/a Pap  NIL 2021    Hgb Electro    COVID vaccinated CF  negative     SMA  negative             Obesity affecting pregnancy in first trimester 01/07/2020 by Zipporah Plants, CNM No   Overview Addendum 06/28/2020  4:53 PM by Mirna Mires, CNM    Family hx of DM (mother with DM and GDM). Patient reports 40lb weight loss in last year. Offered early 1h GTT.  Daily ASA 14 weeks on.            Maternal Medical History:   Past Medical History:  Diagnosis Date   [redacted] weeks gestation of pregnancy    Anxiety    Depression     PVC (premature ventricular contraction)     Past Surgical History:  Procedure Laterality Date   NO PAST SURGERIES      Allergies  Allergen Reactions   Doxycycline     Other reaction(s): Other (See Comments) Bad vaginal yeast infection   Penicillins Hives and Rash    Has patient had a PCN reaction causing immediate rash, facial/tongue/throat swelling, SOB or lightheadedness with hypotension: Yes Has patient had a PCN reaction causing severe rash involving mucus membranes or skin necrosis: No Has patient had a PCN reaction that required hospitalization No Has patient had a PCN reaction occurring within the last 10 years: Yes If all of the above answers are "NO", then may proceed with Cephalosporin use.     Prior to Admission medications   Medication Sig Start Date End Date Taking? Authorizing Provider  citalopram (CELEXA) 20 MG tablet Take by mouth. 02/02/19  Yes [provider]  Prenatal Vit-Fe Fumarate-FA (MULTIVITAMIN-PRENATAL) 27-0.8 MG TABS tablet Take 1 tablet by mouth daily at 12 noon.  Yes [provider]    OB History  Gravida Para Term Preterm AB Living  1 0 0 0 0 0  SAB IAB Ectopic Multiple Live Births  0 0 0 0 0    # Outcome Date GA Lbr Len/2nd Weight Sex Delivery Anes PTL Lv  1 Current             Prenatal care site: [facility]  Social History: She  reports that she has quit smoking. She uses smokeless tobacco. She reports that she does not drink alcohol and does not use drugs.  Family History: family history includes Heart attack in her paternal grandfather; Ovarian cancer in her maternal aunt.    Review of Systems:  Review of Systems  Constitutional:  Negative for chills and fever.  HENT:  Negative for congestion, ear discharge, ear pain, hearing loss, sinus pain and sore throat.   Eyes:  Negative for blurred vision and double vision.  Respiratory:  Negative for cough, shortness of breath and wheezing.   Cardiovascular:   Negative for chest pain, palpitations and leg swelling.  Gastrointestinal:  Negative for abdominal pain, blood in stool, constipation, diarrhea, heartburn, melena, nausea and vomiting.  Genitourinary:  Negative for dysuria, flank pain, frequency, hematuria and urgency.  Musculoskeletal:  Positive for back pain. Negative for joint pain and myalgias.  Skin:  Negative for itching and rash.  Neurological:  Negative for dizziness, tingling, tremors, sensory change, speech change, focal weakness, seizures, loss of consciousness, weakness and headaches.  Endo/Heme/Allergies:  Negative for environmental allergies. Does not bruise/bleed easily.  Psychiatric/Behavioral:  Negative for depression, hallucinations, memory loss, substance abuse and suicidal ideas. The patient is not nervous/anxious and does not have insomnia.     Physical Exam:  BP 124/72   Wt 245 lb (111.1 kg)   LMP 11/28/2019   BMI 39.54 kg/m   Vital Signs: BP 124/72   Wt 245 lb (111.1 kg)   LMP 11/28/2019   BMI 39.54 kg/m  Constitutional: Well nourished, well developed female in no acute distress.  HEENT: normal Skin: Warm and dry.  Cardiovascular: Regular rate and rhythm.   Extremity:  no edema   Respiratory: Clear to auscultation bilateral. Normal respiratory effort Abdomen: FHT present Back: no CVAT Neuro: DTRs 2+, Cranial nerves grossly intact Psych: Alert and Oriented x3. No memory deficits. Normal mood and affect.  MS: normal gait, normal bilateral lower extremity ROM/strength/stability.  Pelvic exam: (female chaperone present) is not limited by body habitus EGBUS: within normal limits Vagina: within normal limits and with normal mucosa  Cervix: 1/40/-3   No results found for: SARSCOV2NAA Covid test pending  Assessment:  Vanessa Thornton is a 25 y.o. G1P0000 female at [redacted]w[redacted]d with elective induction of labor.   Plan:  Admit to Labor & Delivery  CBC, T&S, Clrs, IVF GBS positive: vancomycin 1,000 mg Q 12 hrs  prophylaxis (resistant to clindamycin)  Fetal well-being: reassuring Begin induction based on admission exam    Tresea Mall, CNM 08/17/2020 12:05 PM

## 2020-08-17 NOTE — Progress Notes (Signed)
Desires Cervix check today.

## 2020-08-23 ENCOUNTER — Ambulatory Visit (INDEPENDENT_AMBULATORY_CARE_PROVIDER_SITE_OTHER): Payer: Commercial Managed Care - PPO | Admitting: Advanced Practice Midwife

## 2020-08-23 ENCOUNTER — Other Ambulatory Visit: Payer: Self-pay

## 2020-08-23 ENCOUNTER — Encounter: Payer: Self-pay | Admitting: Advanced Practice Midwife

## 2020-08-23 VITALS — BP 118/80 | Wt 245.0 lb

## 2020-08-23 DIAGNOSIS — O99213 Obesity complicating pregnancy, third trimester: Secondary | ICD-10-CM

## 2020-08-23 DIAGNOSIS — O0993 Supervision of high risk pregnancy, unspecified, third trimester: Secondary | ICD-10-CM

## 2020-08-23 DIAGNOSIS — Z3A38 38 weeks gestation of pregnancy: Secondary | ICD-10-CM

## 2020-08-23 LAB — POCT URINALYSIS DIPSTICK OB
Glucose, UA: NEGATIVE
POC,PROTEIN,UA: NEGATIVE

## 2020-08-23 NOTE — Progress Notes (Signed)
Routine Prenatal Care Visit  Subjective  Vanessa Thornton is a 25 y.o. G1P0000 at [redacted]w[redacted]d being seen today for ongoing prenatal care.  She is currently monitored for the following issues for this high-risk pregnancy and has Family history of ovarian cancer; Supervision of high risk pregnancy, antepartum; Obesity affecting pregnancy in first trimester; Decreased fetal movements in third trimester; Anxiety; and Depression on their problem list.  ----------------------------------------------------------------------------------- Patient reports no complaints.   Contractions: Not present. Vag. Bleeding: None.  Movement: Present. Leaking Fluid denies.  ----------------------------------------------------------------------------------- The following portions of the patient's history were reviewed and updated as appropriate: allergies, current medications, past family history, past medical history, past social history, past surgical history and problem list. Problem list updated.  Objective  Blood pressure 118/80, weight 245 lb (111.1 kg), last menstrual period 11/28/2019. Pregravid weight 194 lb (88 kg) Total Weight Gain 51 lb (23.1 kg) Urinalysis: Urine Protein Negative  Urine Glucose Negative  Fetal Status: Fetal Heart Rate (bpm): 153   Movement: Present  Presentation: Vertex  General:  Alert, oriented and cooperative. Patient is in no acute distress.  Skin: Skin is warm and dry. No rash noted.   Cardiovascular: Normal heart rate noted  Respiratory: Normal respiratory effort, no problems with respiration noted  Abdomen: Soft, gravid, appropriate for gestational age. Pain/Pressure: Absent     Pelvic:  Cervical exam performed Dilation: 1.5 Effacement (%): 60 Station: -3, cervical sweep  Extremities: Normal range of motion.  Edema: None  Mental Status: Normal mood and affect. Normal behavior. Normal judgment and thought content.   Assessment   25 y.o. G1P0000 at [redacted]w[redacted]d by  09/03/2020, by Last  Menstrual Period presenting for routine prenatal visit  Plan   FIRST Problems (from 01/07/20 to present)     Problem Noted Resolved   Anxiety  by Tresea Mall, CNM No   Depression  by Tresea Mall, CNM No   Supervision of high risk pregnancy, antepartum 01/07/2020 by Zipporah Plants, CNM No   Overview Addendum 08/16/2020  5:53 PM by Mirna Mires, CNM     Nursing Staff Provider  Office Location  [facility] Dating   LMP = 6 wk [place]  Language  English Anatomy US  Normal anatomy, female  Flu Vaccine   Genetic Screen  NIPS:  Neg x 3 - XX   TDaP vaccine    Hgb A1C or  GTT Early : 102 Third trimester :   Rhogam   not needed   LAB RESULTS   Feeding Plan  Breast Blood Type     Contraception  Condoms Antibody    Circumcision n/a Rubella    Pediatrician   RPR     Support Person [name] HBsAg     Prenatal Classes Discussed HIV      Varicella Non-immune  BTL Consent n/a GBS  (For PCN allergy, check sensitivities) GBS positive PCN allergy, clind resistant- so Vancomycin       VBAC Consent n/a Pap  NIL 2021    Hgb Electro    COVID vaccinated CF  negative     SMA  negative              Obesity affecting pregnancy in first trimester 01/07/2020 by Zipporah Plants, CNM No   Overview Addendum 06/28/2020  4:53 PM by Mirna Mires, CNM    Family hx of DM (mother with DM and GDM). Patient reports 40lb weight loss in last year. Offered early 1h GTT.  Daily ASA 14 weeks on.  Term labor symptoms and general obstetric precautions including but not limited to vaginal bleeding, contractions, leaking of fluid and fetal movement were reviewed in detail with the patient.   Return for IOL on Sunday.  Tresea Mall, CNM 08/23/2020 4:59 PM

## 2020-08-23 NOTE — Progress Notes (Signed)
ROB - request cervical check. RM 3

## 2020-08-25 ENCOUNTER — Telehealth: Payer: Self-pay

## 2020-08-25 ENCOUNTER — Other Ambulatory Visit: Payer: Self-pay

## 2020-08-25 ENCOUNTER — Other Ambulatory Visit
Admission: RE | Admit: 2020-08-25 | Discharge: 2020-08-25 | Disposition: A | Discharge status: Home / Self Care | Payer: Commercial Managed Care - PPO | Source: Ambulatory Visit | Attending: Obstetrics and Gynecology | Admitting: Obstetrics and Gynecology

## 2020-08-25 DIAGNOSIS — Z01812 Encounter for preprocedural laboratory examination: Secondary | ICD-10-CM | POA: Insufficient documentation

## 2020-08-25 DIAGNOSIS — Z20822 Contact with and (suspected) exposure to covid-19: Secondary | ICD-10-CM | POA: Insufficient documentation

## 2020-08-25 LAB — SARS CORONAVIRUS 2 (TAT 6-24 HRS): SARS Coronavirus 2: NEGATIVE

## 2020-08-25 NOTE — Telephone Encounter (Signed)
Pt called front desk and said that when she went to PAT they said that it did not show her scheduled for IOL on 08/27/20. I called Erskine Squibb to confirm and she confirmed with L&D that patient is on the book for IOL.   Chart had 09/03/20 IOL. I called and spoke to L&D and [place] corrected the date to 7/31. I called pt and told her that it was fixed and that she IS to report on 08/27/20 for IOL.

## 2020-08-27 ENCOUNTER — Other Ambulatory Visit: Payer: Self-pay

## 2020-08-27 ENCOUNTER — Encounter: Payer: Self-pay | Admitting: Obstetrics and Gynecology

## 2020-08-27 ENCOUNTER — Inpatient Hospital Stay
Admission: RE | Admit: 2020-08-27 | Discharge: 2020-08-31 | DRG: 806 | Disposition: A | Discharge status: Home / Self Care | Payer: Commercial Managed Care - PPO | Attending: Obstetrics & Gynecology | Admitting: Obstetrics & Gynecology

## 2020-08-27 DIAGNOSIS — D62 Acute posthemorrhagic anemia: Secondary | ICD-10-CM | POA: Diagnosis not present

## 2020-08-27 DIAGNOSIS — Z3490 Encounter for supervision of normal pregnancy, unspecified, unspecified trimester: Secondary | ICD-10-CM | POA: Diagnosis present

## 2020-08-27 DIAGNOSIS — O26893 Other specified pregnancy related conditions, third trimester: Secondary | ICD-10-CM | POA: Diagnosis present

## 2020-08-27 DIAGNOSIS — F419 Anxiety disorder, unspecified: Secondary | ICD-10-CM

## 2020-08-27 DIAGNOSIS — O99211 Obesity complicating pregnancy, first trimester: Secondary | ICD-10-CM | POA: Diagnosis present

## 2020-08-27 DIAGNOSIS — O99214 Obesity complicating childbirth: Principal | ICD-10-CM | POA: Diagnosis present

## 2020-08-27 DIAGNOSIS — Z3A39 39 weeks gestation of pregnancy: Secondary | ICD-10-CM

## 2020-08-27 DIAGNOSIS — O9081 Anemia of the puerperium: Secondary | ICD-10-CM | POA: Diagnosis not present

## 2020-08-27 DIAGNOSIS — Z87891 Personal history of nicotine dependence: Secondary | ICD-10-CM | POA: Diagnosis not present

## 2020-08-27 DIAGNOSIS — Z880 Allergy status to penicillin: Secondary | ICD-10-CM

## 2020-08-27 DIAGNOSIS — O0990 Supervision of high risk pregnancy, unspecified, unspecified trimester: Secondary | ICD-10-CM

## 2020-08-27 DIAGNOSIS — O99824 Streptococcus B carrier state complicating childbirth: Secondary | ICD-10-CM | POA: Diagnosis present

## 2020-08-27 DIAGNOSIS — O9902 Anemia complicating childbirth: Secondary | ICD-10-CM | POA: Diagnosis not present

## 2020-08-27 DIAGNOSIS — F32A Depression, unspecified: Secondary | ICD-10-CM

## 2020-08-27 DIAGNOSIS — O99213 Obesity complicating pregnancy, third trimester: Secondary | ICD-10-CM | POA: Diagnosis not present

## 2020-08-27 DIAGNOSIS — Z20822 Contact with and (suspected) exposure to covid-19: Secondary | ICD-10-CM | POA: Diagnosis present

## 2020-08-27 DIAGNOSIS — O99343 Other mental disorders complicating pregnancy, third trimester: Secondary | ICD-10-CM

## 2020-08-27 LAB — CBC
HCT: 38.4 % (ref 36.0–46.0)
Hemoglobin: 12.4 g/dL (ref 12.0–15.0)
MCH: 27.5 pg (ref 26.0–34.0)
MCHC: 32.3 g/dL (ref 30.0–36.0)
MCV: 85.1 fL (ref 80.0–100.0)
Platelets: 161 10*3/uL (ref 150–400)
RBC: 4.51 MIL/uL (ref 3.87–5.11)
RDW: 14.5 % (ref 11.5–15.5)
WBC: 5.9 10*3/uL (ref 4.0–10.5)
nRBC: 0 % (ref 0.0–0.2)

## 2020-08-27 LAB — ABO/RH: ABO/RH(D): O POS

## 2020-08-27 MED ORDER — OXYTOCIN-SODIUM CHLORIDE 30-0.9 UT/500ML-% IV SOLN
1.0000 m[IU]/min | INTRAVENOUS | Status: DC
Start: 2020-08-27 — End: 2020-08-28
  Filled 2020-08-27: qty 500

## 2020-08-27 MED ORDER — LIDOCAINE HCL (PF) 1 % IJ SOLN
INTRAMUSCULAR | Status: AC
  Filled 2020-08-27: qty 30

## 2020-08-27 MED ORDER — MISOPROSTOL 200 MCG PO TABS
ORAL_TABLET | ORAL | Status: AC
  Filled 2020-08-27: qty 4

## 2020-08-27 MED ORDER — OXYTOCIN-SODIUM CHLORIDE 30-0.9 UT/500ML-% IV SOLN
2.5000 [IU]/h | INTRAVENOUS | Status: DC
  Administered 2020-08-28: 2.5 [IU]/h via INTRAVENOUS

## 2020-08-27 MED ORDER — TERBUTALINE SULFATE 1 MG/ML IJ SOLN: 0.2500 mg | Freq: Once | INTRAMUSCULAR | Status: DC | PRN

## 2020-08-27 MED ORDER — MISOPROSTOL 25 MCG QUARTER TABLET
25.0000 ug | ORAL_TABLET | Freq: Once | ORAL | Status: AC
  Administered 2020-08-27: 25 ug via ORAL
  Filled 2020-08-27: qty 1

## 2020-08-27 MED ORDER — OXYTOCIN 10 UNIT/ML IJ SOLN
INTRAMUSCULAR | Status: AC
  Filled 2020-08-27: qty 2

## 2020-08-27 MED ORDER — AMMONIA AROMATIC IN INHA
RESPIRATORY_TRACT | Status: AC
  Filled 2020-08-27: qty 10

## 2020-08-27 MED ORDER — ACETAMINOPHEN 325 MG PO TABS: 650.0000 mg | ORAL_TABLET | ORAL | Status: DC | PRN

## 2020-08-27 MED ORDER — BUTORPHANOL TARTRATE 1 MG/ML IJ SOLN
1.0000 mg | INTRAMUSCULAR | Status: DC | PRN
  Administered 2020-08-28 (×3): 1 mg via INTRAVENOUS
  Filled 2020-08-27 (×3): qty 1

## 2020-08-27 MED ORDER — ONDANSETRON HCL 4 MG/2ML IJ SOLN: 4.0000 mg | Freq: Four times a day (QID) | INTRAMUSCULAR | Status: DC | PRN

## 2020-08-27 MED ORDER — OXYTOCIN BOLUS FROM INFUSION
333.0000 mL | Freq: Once | INTRAVENOUS | Status: AC
  Administered 2020-08-28: 333 mL via INTRAVENOUS

## 2020-08-27 MED ORDER — LACTATED RINGERS IV SOLN: 500.0000 mL | INTRAVENOUS | Status: DC | PRN

## 2020-08-27 MED ORDER — DIPHENHYDRAMINE HCL 50 MG/ML IJ SOLN
25.0000 mg | Freq: Once | INTRAMUSCULAR | Status: AC
  Administered 2020-08-27: 25 mg via INTRAVENOUS
  Filled 2020-08-27: qty 1

## 2020-08-27 MED ORDER — VANCOMYCIN HCL IN DEXTROSE 1-5 GM/200ML-% IV SOLN
1000.0000 mg | Freq: Two times a day (BID) | INTRAVENOUS | Status: DC
  Administered 2020-08-27 – 2020-08-28 (×3): 1000 mg via INTRAVENOUS
  Filled 2020-08-27 (×6): qty 200

## 2020-08-27 MED ORDER — DIPHENHYDRAMINE HCL 50 MG/ML IJ SOLN
25.0000 mg | Freq: Two times a day (BID) | INTRAMUSCULAR | Status: DC
  Administered 2020-08-28 (×2): 25 mg via INTRAVENOUS
  Filled 2020-08-27 (×2): qty 1

## 2020-08-27 MED ORDER — LACTATED RINGERS IV SOLN: INTRAVENOUS | Status: DC

## 2020-08-27 MED ORDER — LIDOCAINE HCL (PF) 1 % IJ SOLN: 30.0000 mL | INTRAMUSCULAR | Status: DC | PRN

## 2020-08-27 MED ORDER — MISOPROSTOL 25 MCG QUARTER TABLET
25.0000 ug | ORAL_TABLET | ORAL | Status: DC | PRN
  Administered 2020-08-27 (×2): 25 ug via VAGINAL
  Filled 2020-08-27 (×2): qty 1

## 2020-08-27 MED ORDER — OXYTOCIN-SODIUM CHLORIDE 30-0.9 UT/500ML-% IV SOLN
INTRAVENOUS | Status: AC
  Administered 2020-08-27: 2 m[IU]/min via INTRAVENOUS
  Filled 2020-08-27: qty 500

## 2020-08-28 ENCOUNTER — Encounter: Payer: Self-pay | Admitting: Advanced Practice Midwife

## 2020-08-28 ENCOUNTER — Inpatient Hospital Stay: Payer: Commercial Managed Care - PPO | Admitting: Anesthesiology

## 2020-08-28 DIAGNOSIS — O99213 Obesity complicating pregnancy, third trimester: Secondary | ICD-10-CM

## 2020-08-28 DIAGNOSIS — O9902 Anemia complicating childbirth: Secondary | ICD-10-CM

## 2020-08-28 DIAGNOSIS — Z3A39 39 weeks gestation of pregnancy: Secondary | ICD-10-CM

## 2020-08-28 LAB — CBC
HCT: 28.2 % — ABNORMAL LOW (ref 36.0–46.0)
Hemoglobin: 9.1 g/dL — ABNORMAL LOW (ref 12.0–15.0)
MCH: 27.1 pg (ref 26.0–34.0)
MCHC: 32.3 g/dL (ref 30.0–36.0)
MCV: 83.9 fL (ref 80.0–100.0)
Platelets: 180 10*3/uL (ref 150–400)
RBC: 3.36 MIL/uL — ABNORMAL LOW (ref 3.87–5.11)
RDW: 14.8 % (ref 11.5–15.5)
WBC: 10.1 10*3/uL (ref 4.0–10.5)
nRBC: 0 % (ref 0.0–0.2)

## 2020-08-28 LAB — RPR: RPR Ser Ql: NONREACTIVE

## 2020-08-28 MED ORDER — EPHEDRINE 5 MG/ML INJ: 10.0000 mg | INTRAVENOUS | Status: DC | PRN

## 2020-08-28 MED ORDER — PHENYLEPHRINE 40 MCG/ML (10ML) SYRINGE FOR IV PUSH (FOR BLOOD PRESSURE SUPPORT): 80.0000 ug | PREFILLED_SYRINGE | INTRAVENOUS | Status: DC | PRN

## 2020-08-28 MED ORDER — ONDANSETRON HCL 4 MG PO TABS: 4.0000 mg | ORAL_TABLET | ORAL | Status: DC | PRN

## 2020-08-28 MED ORDER — WITCH HAZEL-GLYCERIN EX PADS
1.0000 "application " | MEDICATED_PAD | CUTANEOUS | Status: DC | PRN
  Filled 2020-08-28: qty 100

## 2020-08-28 MED ORDER — DIPHENHYDRAMINE HCL 25 MG PO CAPS: 25.0000 mg | ORAL_CAPSULE | Freq: Four times a day (QID) | ORAL | Status: DC | PRN

## 2020-08-28 MED ORDER — IBUPROFEN 600 MG PO TABS
600.0000 mg | ORAL_TABLET | Freq: Four times a day (QID) | ORAL | Status: DC
  Administered 2020-08-28 – 2020-08-29 (×2): 600 mg via ORAL
  Filled 2020-08-28 (×2): qty 1

## 2020-08-28 MED ORDER — COCONUT OIL OIL
1.0000 "application " | TOPICAL_OIL | Status: DC | PRN
  Filled 2020-08-28: qty 120

## 2020-08-28 MED ORDER — LACTATED RINGERS IV SOLN: 500.0000 mL | Freq: Once | INTRAVENOUS | Status: DC

## 2020-08-28 MED ORDER — SODIUM CHLORIDE 0.9 % IV SOLN
INTRAVENOUS | Status: DC | PRN
  Administered 2020-08-28: 10 mL via EPIDURAL

## 2020-08-28 MED ORDER — SENNOSIDES-DOCUSATE SODIUM 8.6-50 MG PO TABS
2.0000 | ORAL_TABLET | Freq: Every day | ORAL | Status: DC
  Administered 2020-08-29 – 2020-08-31 (×3): 2 via ORAL
  Filled 2020-08-28 (×3): qty 2

## 2020-08-28 MED ORDER — FENTANYL-BUPIVACAINE-NACL 0.5-0.125-0.9 MG/250ML-% EP SOLN
12.0000 mL/h | EPIDURAL | Status: DC | PRN
  Administered 2020-08-28: 12 mL/h via EPIDURAL

## 2020-08-28 MED ORDER — LIDOCAINE HCL (PF) 1 % IJ SOLN
INTRAMUSCULAR | Status: DC | PRN
  Administered 2020-08-28: 3 mL via SUBCUTANEOUS

## 2020-08-28 MED ORDER — FENTANYL-BUPIVACAINE-NACL 0.5-0.125-0.9 MG/250ML-% EP SOLN
EPIDURAL | Status: AC
  Filled 2020-08-28: qty 250

## 2020-08-28 MED ORDER — SIMETHICONE 80 MG PO CHEW
80.0000 mg | CHEWABLE_TABLET | ORAL | Status: DC | PRN
  Administered 2020-08-31: 80 mg via ORAL
  Filled 2020-08-28: qty 1

## 2020-08-28 MED ORDER — ONDANSETRON HCL 4 MG/2ML IJ SOLN: 4.0000 mg | INTRAMUSCULAR | Status: DC | PRN

## 2020-08-28 MED ORDER — DIPHENHYDRAMINE HCL 50 MG/ML IJ SOLN: 12.5000 mg | INTRAMUSCULAR | Status: DC | PRN

## 2020-08-28 MED ORDER — PRENATAL MULTIVITAMIN CH
1.0000 | ORAL_TABLET | Freq: Every day | ORAL | Status: DC
  Administered 2020-08-29 – 2020-08-31 (×3): 1 via ORAL
  Filled 2020-08-28 (×3): qty 1

## 2020-08-28 MED ORDER — BENZOCAINE-MENTHOL 20-0.5 % EX AERO
1.0000 "application " | INHALATION_SPRAY | CUTANEOUS | Status: DC | PRN
  Filled 2020-08-28 (×2): qty 56

## 2020-08-28 MED ORDER — DIBUCAINE (PERIANAL) 1 % EX OINT: 1.0000 "application " | TOPICAL_OINTMENT | CUTANEOUS | Status: DC | PRN

## 2020-08-28 MED ORDER — CITALOPRAM HYDROBROMIDE 20 MG PO TABS
30.0000 mg | ORAL_TABLET | Freq: Every day | ORAL | Status: DC
  Administered 2020-08-28 – 2020-08-30 (×3): 30 mg via ORAL
  Filled 2020-08-28 (×5): qty 1

## 2020-08-28 MED ORDER — ACETAMINOPHEN 325 MG PO TABS
650.0000 mg | ORAL_TABLET | ORAL | Status: DC | PRN
  Administered 2020-08-28 – 2020-08-30 (×7): 650 mg via ORAL
  Filled 2020-08-28 (×7): qty 2

## 2020-08-28 MED ORDER — LIDOCAINE-EPINEPHRINE (PF) 1.5 %-1:200000 IJ SOLN
INTRAMUSCULAR | Status: DC | PRN
  Administered 2020-08-28: 3 mL via EPIDURAL

## 2020-08-28 NOTE — Anesthesia Preprocedure Evaluation (Signed)
Anesthesia Evaluation  Patient identified by MRN, date of birth, ID band Patient awake    Reviewed: Allergy & Precautions, NPO status , Patient's Chart, lab work & pertinent test results  History of Anesthesia Complications Negative for: history of anesthetic complications  Airway Mallampati: III  TM Distance: >3 FB Neck ROM: Full    Dental no notable dental hx. (+) Teeth Intact   Pulmonary neg pulmonary ROS, neg sleep apnea, neg COPD, Patient abstained from smoking.Not current smoker, former smoker,    Pulmonary exam normal breath sounds clear to auscultation       Cardiovascular Exercise Tolerance: Good METS(-) hypertension(-) CAD and (-) Past MI negative cardio ROS  (-) dysrhythmias  Rhythm:Regular Rate:Normal - Systolic murmurs    Neuro/Psych PSYCHIATRIC DISORDERS Anxiety Depression negative neurological ROS     GI/Hepatic neg GERD  ,(+)     (-) substance abuse  ,   Endo/Other  neg diabetes  Renal/GU negative Renal ROS     Musculoskeletal   Abdominal (+) + obese,   Peds  Hematology   Anesthesia Other Findings Past Medical History: No date: [redacted] weeks gestation of pregnancy No date: Anxiety No date: Depression No date: PVC (premature ventricular contraction)  Reproductive/Obstetrics (+) Pregnancy                             Anesthesia Physical Anesthesia Plan  ASA: 2  Anesthesia Plan: Epidural   Post-op Pain Management:    Induction:   PONV Risk Score and Plan: 2 and Treatment may vary due to age or medical condition and Ondansetron  Airway Management Planned: Natural Airway  Additional Equipment:   Intra-op Plan:   Post-operative Plan:   Informed Consent: I have reviewed the patients History and Physical, chart, labs and discussed the procedure including the risks, benefits and alternatives for the proposed anesthesia with the patient or authorized representative  who has indicated his/her understanding and acceptance.       Plan Discussed with: Surgeon  Anesthesia Plan Comments: (Discussed R/B/A of neuraxial anesthesia technique with patient: - rare risks of spinal/epidural hematoma, nerve damage, infection - Risk of PDPH - Risk of itching - Risk of nausea and vomiting - Risk of poor block necessitating replacement of epidural. - Risk of allergic reactions. Patient voiced understanding.)        Anesthesia Quick Evaluation

## 2020-08-28 NOTE — Progress Notes (Signed)
  Labor Progress Note   25 y.o. G1P0000 @ [redacted]w[redacted]d , admitted for  Pregnancy, Labor Management.   Subjective:  Starting to feel intermittent pressure  Objective:  BP 126/67   Pulse 87   Temp 97.9 F (36.6 C) (Oral)   Resp 19   Ht 5\' 6"  (1.676 m)   Wt 111.1 kg Comment: 245 lbs  LMP 11/28/2019   SpO2 97%   BMI 39.54 kg/m  Abd: gravid, ND, FHT present, mild tenderness on exam Extr: none SVE: CERVIX: anterior lip, 90 effaced, 0 station Trial of pushing to reduce lip- little movement of station  EFM: FHR: 140 bpm, variability: moderate,  accelerations:  Present,  decelerations:  Absent Toco: Frequency: Every 2-5 minutes Labs: I have reviewed the patient's lab results.   Assessment & Plan:  G1P0000 @ [redacted]w[redacted]d, admitted for  Pregnancy and Labor/Delivery Management  1. Pain management: epidural. 2. FWB: FHT category I.  3. ID: GBS positive: s/p 2 doses vancomycin- PCN allergic, Clindamycin resistant 4. Labor management: labor down  All discussed with patient, see orders   [redacted]w[redacted]d, CNM [facility] 08/28/2020  11:43 AM

## 2020-08-28 NOTE — Anesthesia Procedure Notes (Signed)
Epidural Patient location during procedure: OB Start time: 08/28/2020 6:00 AM End time: 08/28/2020 6:25 AM  Staffing Anesthesiologist: Corinda Gubler, MD Performed: anesthesiologist   Preanesthetic Checklist Completed: patient identified, IV checked, site marked, risks and benefits discussed, surgical consent, monitors and equipment checked, pre-op evaluation and timeout performed  Epidural Patient position: sitting Prep: ChloraPrep Patient monitoring: heart rate, continuous pulse ox and blood pressure Approach: midline Location: L3-L4 Injection technique: LOR saline  Needle:  Needle type: Tuohy  Needle gauge: 17 G Needle length: 9 cm and 9 Needle insertion depth: 6.5 cm Catheter type: closed end flexible Catheter size: 19 Gauge Catheter at skin depth: 12 cm Test dose: negative and 1.5% lidocaine with Epi 1:200 K  Assessment Sensory level: T10 Events: blood not aspirated, injection not painful, no injection resistance, no paresthesia and negative IV test  Additional Notes first attempt Pt. Evaluated and documentation done after procedure finished. Patient identified. Risks/Benefits/Options discussed with patient including but not limited to bleeding, infection, nerve damage, paralysis, failed block, incomplete pain control, headache, blood pressure changes, nausea, vomiting, reactions to medication both or allergic, itching and postpartum back pain. Confirmed with bedside nurse the patient's most recent platelet count. Confirmed with patient that they are not currently taking any anticoagulation, have any bleeding history or any family history of bleeding disorders. Patient expressed understanding and wished to proceed. All questions were answered. Sterile technique was used throughout the entire procedure. Please see nursing notes for vital signs. Test dose was given through epidural catheter and negative prior to continuing to dose epidural or start infusion. Warning signs of high  block given to the patient including shortness of breath, tingling/numbness in hands, complete motor block, or any concerning symptoms with instructions to call for help. Patient was given instructions on fall risk and not to get out of bed. All questions and concerns addressed with instructions to call with any issues or inadequate analgesia.     Patient tolerated the insertion well without immediate complications.  Reason for block: procedure for painReason for block:procedure for pain

## 2020-08-28 NOTE — H&P (Signed)
OB History & Physical    History of Present Illness:  Initial H&P: 08/17/2020   Chief Complaint: elective induction of labor   HPI:  Vanessa Thornton is a 25 y.o. G1P0000 female at 39wk dated by LMP c/w 7 week ultrasound.  Her pregnancy has been complicated by obesity, GBS positive allergic to penicillin.     She denies contractions.   She denies leakage of fluid.   She denies vaginal bleeding.   She reports fetal movement.     Total weight gain for pregnancy: 51 lb (23.1 kg)    Obstetrical Problem List: FIRST Problems (from 01/07/20 to present)       Problem Noted Resolved    Supervision of high risk pregnancy, antepartum 01/07/2020 by Zipporah Plants, CNM No    Overview Addendum 08/16/2020  5:53 PM by Mirna Mires, CNM             Nursing Staff Provider  Office Location  [facility] Dating  LMP = 6 wk [place]  Language English Anatomy US Normal anatomy, female  Flu Vaccine   Genetic Screen NIPS:  Neg x 3 - XX    TDaP vaccine   Hgb A1C or GTT Early : 102 Third trimester :  Rhogam  not needed   LAB RESULTS  Feeding Plan  Breast Blood Type     Contraception  Condoms Antibody    Circumcision n/a Rubella    Pediatrician   RPR     Support Person Darrielle Pflieger HBsAg     Prenatal Classes Discussed HIV        Varicella Non-immune  BTL Consent n/a GBS  (For PCN allergy, check sensitivities) GBS positive PCN allergy, clind resistant- so Vancomycin           VBAC Consent n/a Pap  NIL 2021      Hgb Electro    COVID vaccinated CF  negative      SMA  negative                    Obesity affecting pregnancy in first trimester 01/07/2020 by Zipporah Plants, CNM No    Overview Addendum 06/28/2020  4:53 PM by Mirna Mires, CNM      Family hx of DM (mother with DM and GDM). Patient reports 40lb weight loss in last year. Offered early 1h GTT.   Daily ASA 14 weeks on.                 Maternal Medical History:        Past Medical History:  Diagnosis Date   [redacted] weeks  gestation of pregnancy     Anxiety     Depression     PVC (premature ventricular contraction)             Past Surgical History:  Procedure Laterality Date   NO PAST SURGERIES               Allergies  Allergen Reactions   Doxycycline        Other reaction(s): Other (See Comments) Bad vaginal yeast infection   Penicillins Hives and Rash      Has patient had a PCN reaction causing immediate rash, facial/tongue/throat swelling, SOB or lightheadedness with hypotension: Yes Has patient had a PCN reaction causing severe rash involving mucus membranes or skin necrosis: No Has patient had a PCN reaction that required hospitalization No Has patient had a PCN reaction occurring within the last 10 years: Yes If  all of the above answers are "NO", then may proceed with Cephalosporin use.               Prior to Admission medications  Medication Sig Start Date End Date Taking? Authorizing Provider  citalopram (CELEXA) 20 MG tablet Take by mouth. 25/5/21   Yes [provider]  Prenatal Vit-Fe Fumarate-FA (MULTIVITAMIN-PRENATAL) 27-0.8 MG TABS tablet Take 1 tablet by mouth daily at 12 noon.     Yes [provider]                      OB History  Gravida Para Term Preterm AB Living  1 0 0 0 0 0  SAB IAB Ectopic Multiple Live Births     0 0 0 0 0        # Outcome Date GA Lbr Len/2nd Weight Sex Delivery Anes PTL Lv  1 Current                        Prenatal care site: [facility]   Social History: She  reports that she has quit smoking. She uses smokeless tobacco. She reports that she does not drink alcohol and does not use drugs.   Family History: family history includes Heart attack in her paternal grandfather; Ovarian cancer in her maternal aunt.      Review of Systems:  Review of Systems Constitutional:  Negative for chills and fever. HENT:  Negative for congestion, ear discharge, ear pain, hearing loss, sinus pain and sore throat.   Eyes:  Negative for  blurred vision and double vision. Respiratory:  Negative for cough, shortness of breath and wheezing.   Cardiovascular:  Negative for chest pain, palpitations and leg swelling. Gastrointestinal:  Negative for abdominal pain, blood in stool, constipation, diarrhea, heartburn, melena, nausea and vomiting. Genitourinary:  Negative for dysuria, flank pain, frequency, hematuria and urgency. Musculoskeletal:  Positive for back pain. Negative for joint pain and myalgias. Skin:  Negative for itching and rash. Neurological:  Negative for dizziness, tingling, tremors, sensory change, speech change, focal weakness, seizures, loss of consciousness, weakness and headaches. Endo/Heme/Allergies:  Negative for environmental allergies. Does not bruise/bleed easily. Psychiatric/Behavioral:  Negative for depression, hallucinations, memory loss, substance abuse and suicidal ideas. The patient is not nervous/anxious and does not have insomnia.      Physical Exam:  BP 124/72   Wt 245 lb (111.1 kg)   LMP 11/28/2019   BMI 39.54 kg/m   Vital Signs: BP 124/72   Wt 245 lb (111.1 kg)   LMP 11/28/2019   BMI 39.54 kg/m  Constitutional: Well nourished, well developed female in no acute distress. HEENT: normal Skin: Warm and dry. Cardiovascular: Regular rate and rhythm.   Extremity:  no edema   Respiratory: Clear to auscultation bilateral. Normal respiratory effort Abdomen: FHT present Back: no CVAT Neuro: DTRs 2+, Cranial nerves grossly intact Psych: Alert and Oriented x3. No memory deficits. Normal mood and affect. MS: normal gait, normal bilateral lower extremity ROM/strength/stability.   Pelvic exam: (female chaperone present) is not limited by body habitus EGBUS: within normal limits Vagina: within normal limits and with normal mucosa  Cervix: 1/40/-3     No results found for: SARSCOV2NAA Covid test pending   Assessment:  Vanessa Thornton is a 25 y.o. G1P0000 female at 39w with elective induction  of labor.   Plan:  Admit to Labor & Delivery CBC, T&S, Clrs, IVF GBS positive: vancomycin 1,000 mg  Q 12 hrs prophylaxis (resistant to clindamycin)  Fetal well-being: reassuring Begin induction based on admission exam   Adelene Idler MD, [facility], [facility] 08/28/2020 8:13 AM

## 2020-08-28 NOTE — Progress Notes (Signed)
IUPC placed without issue SVE: 5/80/-2  Adelene Idler MD, [facility], [facility] 08/28/2020 8:03 AM

## 2020-08-28 NOTE — Progress Notes (Signed)
  Labor Progress Note   25 y.o. G1P0000 @ [redacted]w[redacted]d , admitted for  Pregnancy, Labor Management.   Subjective:  SROM 2145 Pitocin started at 2230 Epidural this am at 0600  Objective:  BP 111/65 (BP Location: Right Arm)   Pulse 78   Temp 98.2 F (36.8 C) (Oral)   Resp 19   Ht 5\' 6"  (1.676 m)   Wt 111.1 kg Comment: 245 lbs  LMP 11/28/2019   BMI 39.54 kg/m  Abd: gravid, ND, FHT present, mild tenderness on exam Extr: trace to 1+ bilateral pedal edema SVE: 4-5/70/-2 (per Dr 05-05-2001 as IUPC was placed this am)  EFM: FHR: 130 bpm, variability: moderate,  accelerations:  Present,  decelerations:  Absent Toco: mVU 150 at this time Labs: I have reviewed the patient's lab results.   Assessment & Plan:  G1P0000 @ [redacted]w[redacted]d, admitted for  Pregnancy and Labor/Delivery Management  1. Pain management: epidural. 2. FWB: FHT category 1.  3. ID: GBS positive 4. Labor management: Cont Pitocin (18 mU/min) until adequate pattern (mVU) DIscussed active management of labor plan for this am ABX for GBS  All discussed with patient, see orders  [redacted]w[redacted]d, MD, [facility], [facility] 08/28/2020  7:30 AM

## 2020-08-28 NOTE — Discharge Summary (Signed)
OB Discharge Summary     Patient Name: Vanessa Thornton DOB: 1995/06/15 MRN: [id number]  Date of admission: 08/27/2020 Delivering provider: Tresea Mall, CNM  Date of Delivery: 08/28/2020  Date of discharge: 08/31/2020  Admitting diagnosis: Encounter for elective induction of labor [Z34.90] Intrauterine pregnancy: [redacted]w[redacted]d     Secondary diagnosis: None     Discharge diagnosis: Term Pregnancy Delivered                                                                                                Post partum procedures:blood transfusion  Augmentation: Pitocin and Cytotec  Complications: Hemorrhage>1040mL  Hospital course:  Induction of Labor With Vaginal Delivery   25 y.o. yo G1P1001 at [redacted]w[redacted]d was admitted to the hospital 08/27/2020 for induction of labor.  Indication for induction: Elective.  Patient had an uncomplicated labor course as follows: Membrane Rupture Time/Date: 9:50 PM ,08/27/2020   Delivery Method:Vaginal, Spontaneous  Episiotomy: None  Lacerations:  2nd degree  Details of delivery can be found in separate delivery note.    Patient had a postpartum course complicated by blood loss anemia. She received 2 units PRBC on 08/30/20. At time of discharge, patient is tolerating regular diet, her pain is controlled with PO medication, she is ambulating and voiding without difficulty. She denies any s/s of anemia. She reports breastfeeding is going well. Patient is discharged home 08/31/20.  Newborn Data: Birth date:08/28/2020  Birth time:2:25 PM  Gender:Female  Everleigh Hope Living status:Living  Apgars:8 ,9  Weight: 3740 g, 8 pounds 4 ounces  Physical exam  Vitals:   08/30/20 1700 08/30/20 1855 08/30/20 2302 08/31/20 0736  BP: 125/71 126/82 121/79 122/81  Pulse: 87 78 77 74  Resp: 18 20 18 18   Temp: 98.8 F (37.1 C) 99.3 F (37.4 C) 98.5 F (36.9 C) 97.8 F (36.6 C)  TempSrc: Oral Oral Oral Oral  SpO2: 100% 99% 100% 100%  Weight:      Height:       General: alert,  cooperative, and no distress Lochia: appropriate Uterine Fundus: firm Incision: N/A DVT Evaluation: No evidence of DVT seen on physical exam.  Labs: Lab Results  Component Value Date   WBC 6.1 08/30/2020   HGB 8.9 (L) 08/30/2020   HCT 26.7 (L) 08/30/2020   MCV 87.0 08/30/2020   PLT 155 08/30/2020    Discharge instruction: per After Visit Summary.  Medications:  Allergies as of 08/31/2020       Reactions   Doxycycline    Other reaction(s): Other (See Comments) Bad vaginal yeast infection   Penicillins Hives, Rash   Has patient had a PCN reaction causing immediate rash, facial/tongue/throat swelling, SOB or lightheadedness with hypotension: Yes Has patient had a PCN reaction causing severe rash involving mucus membranes or skin necrosis: No Has patient had a PCN reaction that required hospitalization No Has patient had a PCN reaction occurring within the last 10 years: Yes If all of the above answers are "NO", then may proceed with Cephalosporin use.        Medication List     TAKE these medications  citalopram 20 MG tablet Commonly known as: CeleXA Take 1.5 tablets (30 mg total) by mouth daily.   Ferrous Fumarate-Folic Acid 324-1 MG Tabs Take 1 tablet by mouth daily with breakfast.   multivitamin-prenatal 27-0.8 MG Tabs tablet Take 1 tablet by mouth daily at 12 noon.        Diet: routine diet  Activity: Advance as tolerated. Pelvic rest for 6 weeks.   Outpatient follow up:  Follow-up Information     Tresea Mall, CNM. Go in 2 week(s).   Specialty: Obstetrics Why: postpartum follow up for CBC and make appointment for 6 week visit Contact information: [address] [place] [address] [phone]                   Postpartum contraception: Condoms Rhogam Given postpartum: NA Rubella vaccine given postpartum: immune Varicella vaccine given postpartum: immune TDaP given antepartum or postpartum: given antepartum   Newborn  Delivery   Birth date/time: 08/28/2020 14:25:00 Delivery type: Vaginal, Spontaneous       Baby Feeding: Breast  Disposition: pending bilirubin check: will either discharge to home or stay for bili lights. Mom will room in if baby stays.  SIGNED:  Tresea Mall, CNM 08/31/2020 9:33 AM

## 2020-08-29 LAB — CBC
HCT: 26.2 % — ABNORMAL LOW (ref 36.0–46.0)
Hemoglobin: 8.3 g/dL — ABNORMAL LOW (ref 12.0–15.0)
MCH: 26.9 pg (ref 26.0–34.0)
MCHC: 31.7 g/dL (ref 30.0–36.0)
MCV: 85.1 fL (ref 80.0–100.0)
Platelets: 201 10*3/uL (ref 150–400)
RBC: 3.08 MIL/uL — ABNORMAL LOW (ref 3.87–5.11)
RDW: 14.8 % (ref 11.5–15.5)
WBC: 9.1 10*3/uL (ref 4.0–10.5)
nRBC: 0 % (ref 0.0–0.2)

## 2020-08-29 MED ORDER — IBUPROFEN 600 MG PO TABS
600.0000 mg | ORAL_TABLET | Freq: Four times a day (QID) | ORAL | Status: DC
  Administered 2020-08-29 – 2020-08-31 (×8): 600 mg via ORAL
  Filled 2020-08-29 (×8): qty 1

## 2020-08-29 NOTE — Lactation Note (Signed)
This note was copied from a baby's chart. Lactation Consultation Note  Patient Name: Vanessa Thornton Date: 08/29/2020 Reason for consult: Initial assessment;Primapara;Term Age:25 hours  Initial lactation visit.  Mom is G1P1, SVD 20 hours ago. Baby has had feedings logged, but has been spitty and was deep suctioned this morning per Pediatrician request.  Pecola Leisure is active at the breast in football hold on the L breast, mom independent in her latching and positioning; LC added support pillow under arm.  Baby was active on/off, mom mentioned being told baby could have a tongue-tie. LC reviewed signs/symptoms, mom denied feeling discomfort or nipple damage at this time. Encouraged to keep a check, report as soon as possible.  Reviewed newborn stomach size, feeding patterns and behaviors, timing of anticipatory growth spurt/cluster feedings, hand expression, and output expectations.  Educated parents on milk supply and demand and normal course of lactation. Encouraged on demand feedings, and calling for support as needed.  Whiteboard updated with LC name/number.  Maternal Data Has patient been taught Hand Expression?: Yes Does the patient have breastfeeding experience prior to this delivery?: No  Feeding Mother's Current Feeding Choice: Breast Milk  LATCH Score Latch: Repeated attempts needed to sustain latch, nipple held in mouth throughout feeding, stimulation needed to elicit sucking reflex.  Audible Swallowing: A few with stimulation  Type of Nipple: Everted at rest and after stimulation  Comfort (Breast/Nipple): Soft / non-tender  Hold (Positioning): No assistance needed to correctly position infant at breast.  LATCH Score: 8   Lactation Tools Discussed/Used    Interventions Interventions: Breast feeding basics reviewed;Hand express;Support pillows;Education  Discharge Pump: Personal  Consult Status Consult Status: Follow-up Date: 08/29/20 Follow-up type:  In-patient    [name] 08/29/2020, 10:41 AM

## 2020-08-29 NOTE — Discharge Instructions (Signed)
Discharge Instructions:   Follow-up Appointment: Call and schedule your follow-up appointment for a visit in 6-weeks with Jane Gledhill, CNM at [facility]!   If there are any new medications, they have been ordered and will be available for pickup at the listed pharmacy on your way home from the hospital.   Call office if you have any of the following: headache, visual changes, fever >101.0 F, chills, shortness of breath, breast concerns, excessive vaginal bleeding, incision drainage or problems, leg pain or redness, depression or any other concerns. If you have vaginal discharge with an odor, let your doctor know.   It is normal to bleed for up to 6 weeks. You should not soak through more than 1 pad in 1 hour. If you have a blood clot larger than your fist with continued bleeding, call your doctor.   Activity: Do not lift > 10 lbs for 6 weeks (do not lift anything heavier than your baby). No intercourse, tampons, swimming pools, hot tubs, baths (only showers) for 6 weeks.  No driving for 1-2 weeks. Continue prenatal vitamin, especially if breastfeeding. Increase calories and fluids (water) while breastfeeding.   Your milk will come in, in the next couple of days (right now it is colostrum). You may have a slight fever when your milk comes in, but it should go away on its own.  If it does not, and rises above 101 F please call the doctor. You will also feel achy and your breasts will be firm. They will also start to leak. If you are breastfeeding, continue as you have been and you can pump/express milk for comfort.   If you have too much milk, your breasts can become engorged, which could lead to mastitis. This is an infection of the milk ducts. It can be very painful and you will need to notify your doctor to obtain a prescription for antibiotics. You can also treat it with a shower or hot/cold compress.   For concerns about your baby, please call your pediatrician.  For breastfeeding  concerns, the lactation consultant can be reached at [phone].   Postpartum blues (feelings of happy one minute and sad another minute) are normal for the first few weeks but if it gets worse let your doctor know.   Congratulations! We enjoyed caring for you and your new bundle of joy!   

## 2020-08-29 NOTE — Anesthesia Postprocedure Evaluation (Signed)
Anesthesia Post Note  Patient: Vanessa Thornton  Procedure(s) Performed: AN AD HOC LABOR EPIDURAL  Patient location during evaluation: Mother Baby Anesthesia Type: Epidural Level of consciousness: oriented and awake and alert Pain management: pain level controlled Vital Signs Assessment: post-procedure vital signs reviewed and stable Respiratory status: spontaneous breathing and respiratory function stable Cardiovascular status: blood pressure returned to baseline and stable Postop Assessment: no headache, no backache, no apparent nausea or vomiting and able to ambulate Anesthetic complications: no   No notable events documented.   Last Vitals:  Vitals:   08/28/20 2325 08/29/20 0326  BP: 138/80 115/61  Pulse: 97 74  Resp: 18 18  Temp: 37.3 C 36.8 C  SpO2: 99% 99%    Last Pain:  Vitals:   08/29/20 0326  TempSrc: Oral  PainSc:                  Starling Manns

## 2020-08-29 NOTE — Progress Notes (Signed)
Subjective:  Doing well, appropriate lochia.  No fevers, chills.  No issues with ambulation  Objective:  Vital signs in last 24 hours: Temp:  [97.9 F (36.6 C)-99.2 F (37.3 C)] 97.9 F (36.6 C) (08/02 0756) Pulse Rate:  [74-131] 84 (08/02 0756) Resp:  [16-18] 18 (08/02 0756) BP: (107-138)/(50-80) 116/72 (08/02 0756) SpO2:  [97 %-100 %] 100 % (08/02 0756)    General: NAD Pulmonary: no increased work of breathing Abdomen: non-distended, non-tender, fundus firm at level of umbilicus Extremities: no edema, no erythema, no tenderness  Results for orders placed or performed during the hospital encounter of 08/27/20 (from the past 72 hour(s))  CBC     Status: None   Collection Time: 08/27/20 12:39 PM  Result Value Ref Range   WBC 5.9 4.0 - 10.5 K/uL   RBC 4.51 3.87 - 5.11 MIL/uL   Hemoglobin 12.4 12.0 - 15.0 g/dL   HCT 63.8 46.6 - 59.9 %   MCV 85.1 80.0 - 100.0 fL   MCH 27.5 26.0 - 34.0 pg   MCHC 32.3 30.0 - 36.0 g/dL   RDW 35.7 01.7 - 79.3 %   Platelets 161 150 - 400 K/uL   nRBC 0.0 0.0 - 0.2 %    Comment: Performed at [facility], [address] [place], [place] [address]  RPR     Status: None   Collection Time: 08/27/20 12:39 PM  Result Value Ref Range   RPR Ser Ql NON REACTIVE NON REACTIVE    Comment: Performed at [facility], [address], [place], [place] [address]  Type and screen     Status: None   Collection Time: 08/27/20 12:40 PM  Result Value Ref Range   ABO/RH(D) O POS    Antibody Screen NEG    Sample Expiration      08/30/2020,2359 Performed at [facility], [address] [place] [place], [place] [address]   ABO/Rh     Status: None   Collection Time: 08/27/20  2:06 PM  Result Value Ref Range   ABO/RH(D)      O POS Performed at [facility], [address] [place], [place] [address]   CBC     Status: Abnormal   Collection Time: 08/28/20  7:21 PM  Result Value Ref Range   WBC 10.1 4.0 - 10.5 K/uL   RBC 3.36 (L)  3.87 - 5.11 MIL/uL   Hemoglobin 9.1 (L) 12.0 - 15.0 g/dL    Comment: REPEATED TO VERIFY PATIENT IS HIGH RISK PREGNANCY. GAVE BIRTH    HCT 28.2 (L) 36.0 - 46.0 %   MCV 83.9 80.0 - 100.0 fL   MCH 27.1 26.0 - 34.0 pg   MCHC 32.3 30.0 - 36.0 g/dL   RDW 45.6 25.6 - 38.9 %   Platelets 180 150 - 400 K/uL   nRBC 0.0 0.0 - 0.2 %    Comment: Performed at [facility], [address] [place], [place] [address]  CBC     Status: Abnormal   Collection Time: 08/29/20  4:49 AM  Result Value Ref Range   WBC 9.1 4.0 - 10.5 K/uL   RBC 3.08 (L) 3.87 - 5.11 MIL/uL   Hemoglobin 8.3 (L) 12.0 - 15.0 g/dL   HCT 87.6 (L) 81.1 - 57.2 %   MCV 85.1 80.0 - 100.0 fL   MCH 26.9 26.0 - 34.0 pg   MCHC 31.7 30.0 - 36.0 g/dL   RDW 62.0 35.5 - 97.4 %  Platelets 201 150 - 400 K/uL   nRBC 0.0 0.0 - 0.2 %    Comment: Performed at [facility], [address] [place], [place] [address]   Immunization History  Administered Date(s) Administered   PFIZER(Purple Top)SARS-COV-2 Vaccination 08/02/2019   Tdap 06/28/2020    Assessment:   25 y.o. G1P1001 postpartum day # 1 TSV, PPH  Plan:    1) Acute blood loss anemia - hemodynamically stable and asymptomatic - po ferrous sulfate - 4 point Hgb drop will recheck tomorrow AM if patient elects to stay  2) Blood Type --/--/O POS Performed at [facility], [address] [place], [place] [address]  [phone]) / Rubella 2.25 (12/10 0911) / Varicella Immune  3) TDAP status up to date  4) Feeding plan breast  5)  Education given regarding options for contraception, as well as compatibility with breast feeding if applicable.    6) Disposition discharge tomorrow recommended given PPH but may elect to go home later today if bleeding remain light and asymptomatic  Vena Austria, MD, [facility], [facility] 08/29/2020, 8:23 AM

## 2020-08-30 LAB — CBC
HCT: 21.4 % — ABNORMAL LOW (ref 36.0–46.0)
Hemoglobin: 6.8 g/dL — ABNORMAL LOW (ref 12.0–15.0)
MCH: 27.6 pg (ref 26.0–34.0)
MCHC: 31.8 g/dL (ref 30.0–36.0)
MCV: 87 fL (ref 80.0–100.0)
Platelets: 155 10*3/uL (ref 150–400)
RBC: 2.46 MIL/uL — ABNORMAL LOW (ref 3.87–5.11)
RDW: 14.9 % (ref 11.5–15.5)
WBC: 6.1 10*3/uL (ref 4.0–10.5)
nRBC: 0 % (ref 0.0–0.2)

## 2020-08-30 LAB — HEMOGLOBIN AND HEMATOCRIT, BLOOD
HCT: 24.2 % — ABNORMAL LOW (ref 36.0–46.0)
HCT: 26.7 % — ABNORMAL LOW (ref 36.0–46.0)
Hemoglobin: 7.7 g/dL — ABNORMAL LOW (ref 12.0–15.0)
Hemoglobin: 8.9 g/dL — ABNORMAL LOW (ref 12.0–15.0)

## 2020-08-30 LAB — PREPARE RBC (CROSSMATCH)

## 2020-08-30 MED ORDER — SODIUM CHLORIDE 0.9% IV SOLUTION: Freq: Once | INTRAVENOUS | Status: AC

## 2020-08-30 MED ORDER — ACETAMINOPHEN 325 MG PO TABS
650.0000 mg | ORAL_TABLET | Freq: Once | ORAL | Status: AC
  Administered 2020-08-30: 650 mg via ORAL
  Filled 2020-08-30: qty 2

## 2020-08-30 MED ORDER — DIPHENHYDRAMINE HCL 25 MG PO CAPS
25.0000 mg | ORAL_CAPSULE | Freq: Once | ORAL | Status: AC
  Administered 2020-08-30: 25 mg via ORAL
  Filled 2020-08-30: qty 1

## 2020-08-30 MED ORDER — SODIUM CHLORIDE 0.9 % IV SOLN
INTRAVENOUS | Status: DC | PRN
  Administered 2020-08-30: 250 mL via INTRAVENOUS

## 2020-08-30 NOTE — Progress Notes (Signed)
Post Partum Day 2 Subjective: up ad lib, voiding, tolerating PO, and she feels very tired. Her husband reports that she looks quite pale. She has been able to get up to the bathroom, and has not felt faint, just very fatigued. She has been breastfeeding her baby.  Objective: Blood pressure 128/68, pulse 77, temperature 98 F (36.7 C), temperature source Oral, resp. rate 20, height 5\' 6"  (1.676 m), weight 111.1 kg, last menstrual period 11/28/2019, SpO2 98 %, unknown if currently breastfeeding.  Physical Exam:  General: cooperative, fatigued, no distress, moderately obese, and pale Lips and skin are very pale. Lochia: appropriate Uterine Fundus: firm Incision: healing well, no significant drainage, some slight edema DVT Evaluation: No evidence of DVT seen on physical exam. Negative Homan's sign.  Recent Labs    08/29/20 0449 08/30/20 0534  HGB 8.3* 6.8*  HCT 26.2* 21.4*    Assessment/Plan: Postpartum day #2. Blood loss anemia ( Postpartum hemorrhage 1200 cc) Discussed the latest H and H results and recommendation that she receive 2 units of red blood cells for which she provides consent.will order 2 units to infuse over 4 hours.  She desires discharge home later today. Will re evaluate after she has received the blood, and consider discharge this evening.    LOS: 3 days   10/30/20 08/30/2020, 11:41 AM

## 2020-08-31 MED ORDER — FERROUS FUMARATE-FOLIC ACID 324-1 MG PO TABS: 1.0000 | ORAL_TABLET | Freq: Every day | ORAL | 1 refills | Status: DC

## 2020-08-31 MED ORDER — IBUPROFEN 600 MG PO TABS
600.0000 mg | ORAL_TABLET | Freq: Four times a day (QID) | ORAL | Status: DC
  Administered 2020-08-31: 600 mg via ORAL
  Filled 2020-08-31: qty 1

## 2020-08-31 NOTE — Progress Notes (Signed)
Pt discharged. Infant transferred to Pediatrics  Discharge instructions, prescriptions and follow up appointment given to and reviewed with pt. Pt verbalized understanding.

## 2020-08-31 NOTE — Lactation Note (Signed)
This note was copied from a baby's chart. Lactation Consultation Note  Patient Name: Vanessa Thornton [id number] Date: 08/31/2020 Reason for consult: Follow-up assessment;Primapara;Term;Hyperbilirubinemia Age:25 hours  Lactation follow-up. Elevated bilirubin levels. Pediatrician orders: triple phototherapy, BF every 2-3 hours + supplementation of 48mL minimum.  LC in room to discuss plan. Mom has 4-5 bags of expressed colostrum at home of at least 9mL, if not more. Dad to run home and get.  Pump set-up at bedside. Mom instructed on set-up, use, cleaning, and milk storage. Mom instructed to continue feeding minimum every 3 hours, or sooner if baby cues + Supplement of at least 62mL.  Current feeding: EBM of mothers milk, and parents opt for donor milk if needed.  LC to follow-up throughout day as needed.  Maternal Data Has patient been taught Hand Expression?: Yes Does the patient have breastfeeding experience prior to this delivery?: No  Feeding Mother's Current Feeding Choice: Breast Milk  LATCH Score                    Lactation Tools Discussed/Used Tools: Pump Breast pump type: Double-Electric Breast Pump Pump Education: Setup, frequency, and cleaning;Milk Storage Reason for Pumping: Hyperbilirubnemia Pumping frequency: q 3 hrs  Interventions Interventions: Breast feeding basics reviewed;Hand express;Education;DEBP  Discharge Pump: Personal;Manual Vanessa Thornton)  Consult Status Consult Status: Follow-up Date: 08/31/20 Follow-up type: In-patient    Vanessa Thornton 08/31/2020, 10:15 AM

## 2020-09-01 LAB — TYPE AND SCREEN
ABO/RH(D): O POS
Antibody Screen: NEGATIVE
Unit division: 0
Unit division: 0

## 2020-09-01 LAB — BPAM RBC
Blood Product Expiration Date: 202209032359
Blood Product Expiration Date: 202209032359
ISSUE DATE / TIME: 202208031224
ISSUE DATE / TIME: 202208031634
Unit Type and Rh: 5100
Unit Type and Rh: 5100

## 2020-09-14 ENCOUNTER — Other Ambulatory Visit: Payer: Commercial Managed Care - PPO

## 2020-09-16 ENCOUNTER — Other Ambulatory Visit: Payer: Self-pay | Admitting: Advanced Practice Midwife

## 2020-09-16 DIAGNOSIS — O0993 Supervision of high risk pregnancy, unspecified, third trimester: Secondary | ICD-10-CM

## 2020-09-16 DIAGNOSIS — F419 Anxiety disorder, unspecified: Secondary | ICD-10-CM

## 2020-09-16 DIAGNOSIS — O99343 Other mental disorders complicating pregnancy, third trimester: Secondary | ICD-10-CM

## 2020-09-18 ENCOUNTER — Other Ambulatory Visit: Payer: Self-pay

## 2020-09-18 ENCOUNTER — Other Ambulatory Visit: Payer: Self-pay | Admitting: Advanced Practice Midwife

## 2020-09-18 ENCOUNTER — Other Ambulatory Visit: Payer: Commercial Managed Care - PPO

## 2020-09-18 DIAGNOSIS — D62 Acute posthemorrhagic anemia: Secondary | ICD-10-CM

## 2020-09-18 NOTE — Progress Notes (Signed)
Order placed for cbc.  

## 2020-09-19 LAB — CBC
Hematocrit: 36.7 % (ref 34.0–46.6)
Hemoglobin: 11.9 g/dL (ref 11.1–15.9)
MCH: 27.4 pg (ref 26.6–33.0)
MCHC: 32.4 g/dL (ref 31.5–35.7)
MCV: 84 fL (ref 79–97)
Platelets: 322 10*3/uL (ref 150–450)
RBC: 4.35 x10E6/uL (ref 3.77–5.28)
RDW: 14.2 % (ref 11.7–15.4)
WBC: 6.2 10*3/uL (ref 3.4–10.8)

## 2020-09-20 NOTE — Telephone Encounter (Signed)
90 day Celexa Rx approved

## 2020-09-29 ENCOUNTER — Telehealth: Payer: Self-pay

## 2020-09-29 NOTE — Telephone Encounter (Signed)
Spoke with Vanessa Thornton face to face about bringing patient in for work in  for possible mastitis. Vanessa Thornton advised verbally to contact patient about sending message with photo and symptoms thru my chart for her to be able to see what is going on for her to prescribe something. I called and left voicemail for patient to call back.

## 2020-09-29 NOTE — Telephone Encounter (Signed)
Please call pt husband and schedule her for this afternoon for mastitis.[phone] Vanessa Thornton

## 2020-10-10 ENCOUNTER — Ambulatory Visit: Payer: Commercial Managed Care - PPO | Admitting: Advanced Practice Midwife

## 2020-10-17 ENCOUNTER — Ambulatory Visit (INDEPENDENT_AMBULATORY_CARE_PROVIDER_SITE_OTHER): Payer: Commercial Managed Care - PPO | Admitting: Advanced Practice Midwife

## 2020-10-17 ENCOUNTER — Other Ambulatory Visit: Payer: Self-pay

## 2020-10-17 ENCOUNTER — Encounter: Payer: Self-pay | Admitting: Advanced Practice Midwife

## 2020-10-17 DIAGNOSIS — Z3A01 Less than 8 weeks gestation of pregnancy: Secondary | ICD-10-CM

## 2020-10-18 NOTE — Progress Notes (Signed)
Postpartum Visit  Date of Service: 10/17/2020  Chief Complaint:  Chief Complaint  Patient presents with   Post-op Follow-up    6 wk postpartum - contraception (condoms) no concerns. RM 5    History of Present Illness: Patient is a 25 y.o. G1P1001 presents for postpartum visit.   Review the Delivery Report for details.  Date of delivery: 08/28/2020 Type of delivery: Vaginal delivery - Vacuum or forceps assisted  no Episiotomy No.  Laceration: 2nd degree  Pregnancy or labor problems:  no Any problems since the delivery:  no  Newborn Details:  SINGLETON :  1. BabyGender female. Birth weight: 8 pounds 4 ounces Maternal Details:  Breast or formula feeding: breastfeeding Intercourse: No  Contraception after delivery:  condoms Any bowel or bladder issues: No  Post partum depression/anxiety noted:  no [place] Post-Partum Depression Score: 2 Date of last PAP: 1 year ago  no abnormalities   Review of Systems: Review of Systems  Constitutional:  Negative for chills and fever.  HENT:  Negative for congestion, ear discharge, ear pain, hearing loss, sinus pain and sore throat.   Eyes:  Negative for blurred vision and double vision.  Respiratory:  Negative for cough, shortness of breath and wheezing.   Cardiovascular:  Negative for chest pain, palpitations and leg swelling.  Gastrointestinal:  Negative for abdominal pain, blood in stool, constipation, diarrhea, heartburn, melena, nausea and vomiting.  Genitourinary:  Negative for dysuria, flank pain, frequency, hematuria and urgency.  Musculoskeletal:  Negative for back pain, joint pain and myalgias.  Skin:  Negative for itching and rash.  Neurological:  Negative for dizziness, tingling, tremors, sensory change, speech change, focal weakness, seizures, loss of consciousness, weakness and headaches.  Endo/Heme/Allergies:  Negative for environmental allergies. Does not bruise/bleed easily.  Psychiatric/Behavioral:  Negative for  depression, hallucinations, memory loss, substance abuse and suicidal ideas. The patient is not nervous/anxious and does not have insomnia.     Past Medical History:  Past Medical History:  Diagnosis Date   [redacted] weeks gestation of pregnancy    Anxiety    Depression    PVC (premature ventricular contraction)     Past Surgical History:  Past Surgical History:  Procedure Laterality Date   WISDOM TOOTH EXTRACTION      Family History:  Family History  Problem Relation Age of Onset   Heart attack Paternal Grandfather    Ovarian cancer Maternal Aunt        45s    Social History:  Social History   Socioeconomic History   Marital status: Married    Spouse name: Not on file   Number of children: Not on file   Years of education: Not on file   Highest education level: Not on file  Occupational History   Not on file  Tobacco Use   Smoking status: Former    Years: 2.00    Types: Cigarettes   Smokeless tobacco: Never  Vaping Use   Vaping Use: Former  Substance and Sexual Activity   Alcohol use: No    Comment: occassionally   Drug use: No   Sexual activity: Yes    Partners: Male    Birth control/protection: None, Condom  Other Topics Concern   Not on file  Social History Narrative   Not on file   Social Determinants of Health   Financial Resource Strain: Not on file  Food Insecurity: Not on file  Transportation Needs: Not on file  Physical Activity: Not on file  Stress: Not on file  Social Connections: Not on file  Intimate Partner Violence: Not on file    Allergies:  Allergies  Allergen Reactions   Doxycycline     Other reaction(s): Other (See Comments) Bad vaginal yeast infection   Penicillins Hives and Rash    Has patient had a PCN reaction causing immediate rash, facial/tongue/throat swelling, SOB or lightheadedness with hypotension: Yes Has patient had a PCN reaction causing severe rash involving mucus membranes or skin necrosis: No Has patient had a  PCN reaction that required hospitalization No Has patient had a PCN reaction occurring within the last 10 years: Yes If all of the above answers are "NO", then may proceed with Cephalosporin use.     Medications: Prior to Admission medications   Medication Sig Start Date End Date Taking? Authorizing Provider  citalopram (CELEXA) 20 MG tablet TAKE 1 AND 1/2 TABLETS DAILY BY MOUTH 09/20/20  Yes Tresea Mall, CNM  Prenatal Vit-Fe Fumarate-FA (MULTIVITAMIN-PRENATAL) 27-0.8 MG TABS tablet Take 1 tablet by mouth daily at 12 noon.   Yes [provider]    Physical Exam Blood pressure 132/68, height 5\' 6"  (1.676 m), weight 234 lb (106.1 kg), currently breastfeeding.    General: NAD HEENT: normocephalic, anicteric Pulmonary: No increased work of breathing Abdomen: NABS, soft, non-tender, non-distended.  Umbilicus without lesions.  No hepatomegaly, splenomegaly or masses palpable. No evidence of hernia. Genitourinary:  External: Normal external female genitalia.  Normal urethral meatus, normal  Bartholin's and Skene's glands.    Vagina: Normal vaginal mucosa, no evidence of prolapse.    Cervix: no CMT  Uterus: Non-enlarged, mobile, normal contour.    Adnexa: ovaries non-enlarged, no adnexal masses  Rectal: deferred Extremities: no edema, erythema, or tenderness Neurologic: Grossly intact Psychiatric: mood appropriate, affect full   Edinburgh Postnatal Depression Scale - 10/17/20 1612       Edinburgh Postnatal Depression Scale:  In the Past 7 Days   I have been able to laugh and see the funny side of things. 0    I have looked forward with enjoyment to things. 0    I have blamed myself unnecessarily when things went wrong. 0    I have been anxious or worried for no good reason. 1    I have felt scared or panicky for no good reason. 0    Things have been getting on top of me. 1    I have been so unhappy that I have had difficulty sleeping. 0    I have felt sad or miserable. 0     I have been so unhappy that I have been crying. 0    The thought of harming myself has occurred to me. 0    Edinburgh Postnatal Depression Scale Total 2             Assessment: 25 y.o. G1P1001 presenting for 6 week postpartum visit  Plan: Problem List Items Addressed This Visit   None Visit Diagnoses     6 weeks postpartum follow-up    -  Primary        1) Contraception - Education given regarding options for contraception, as well as compatibility with breast feeding if applicable.  Patient plans on condoms for contraception.  2)  Pap - ASCCP guidelines and rationale discussed.  Patient opts for every 3 years screening interval- due 2024  3) Patient underwent screening for postpartum depression with no signs of depression  4) Return in about 1 year (around 10/17/2021) for annual  established gyn.   Tresea Mall, CNM [facility] 10/18/2020, 3:38 PM

## 2021-01-28 NOTE — L&D Delivery Note (Signed)
Delivery Note At 9:42 AM a viable and healthy female was delivered via Vaginal, Spontaneous (Presentation:   Occiput Anterior).  APGAR: 9, 9; weight 7 pounds 15 ounces  .   Placenta status: Spontaneous;Pathology, Intact.  Cord: 3 vessels with the following complications: None.  Cord pH: n/a  Anesthesia: Epidural Episiotomy: None Lacerations: 2nd degree Suture Repair: 3.0 vicryl Est. Blood Loss (mL): 300  Mom to postpartum.  Baby to Couplet care / Skin to Skin.  Vanessa Thornton A Vanessa Thornton 12/29/2021, 11:12 AM

## 2021-02-16 ENCOUNTER — Encounter: Payer: Self-pay | Admitting: Obstetrics

## 2021-05-13 ENCOUNTER — Encounter: Payer: Self-pay | Admitting: Obstetrics

## 2021-05-22 IMAGING — US US OB COMP +14 WK
1 series · 13 of 28 positions shown · non-contrast
Comparison: none

CLINICAL DATA: Second trimester pregnancy for fetal anatomy survey.

EXAM:
OBSTETRICAL ULTRASOUND >14 WKS

[Series 1: us ob comp +14 wk · 0.28mm/px · 13 of 84 slices shown]
[im 4/84]
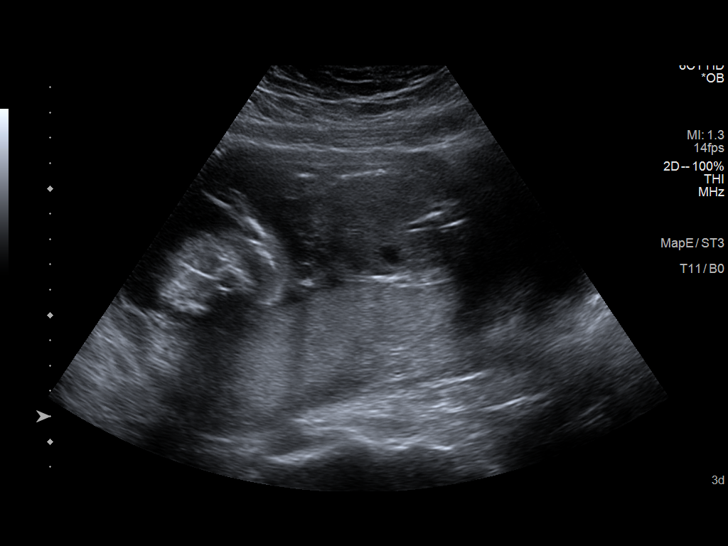
[im 10/84]
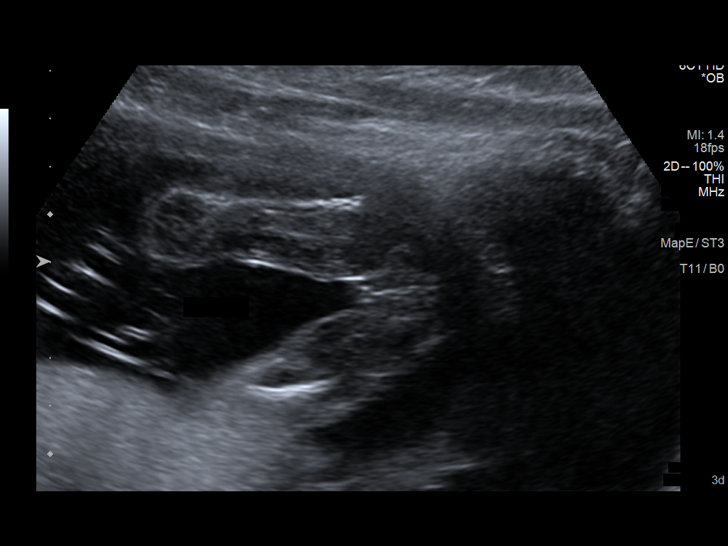
[im 16/84]
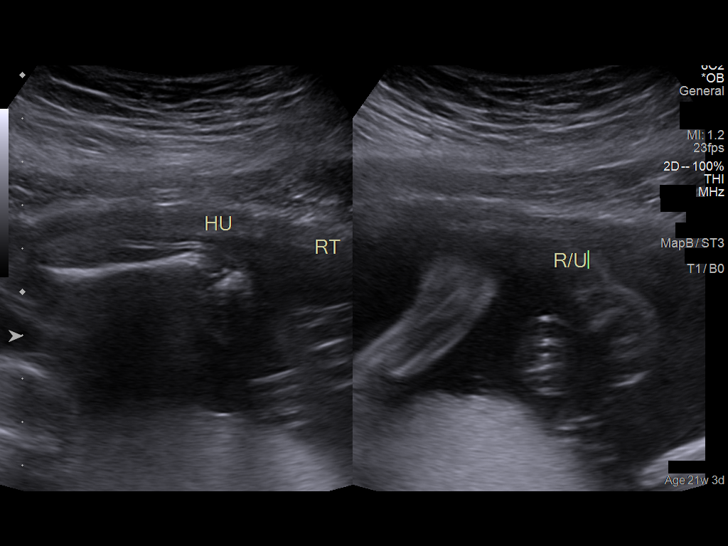
[im 22/84]
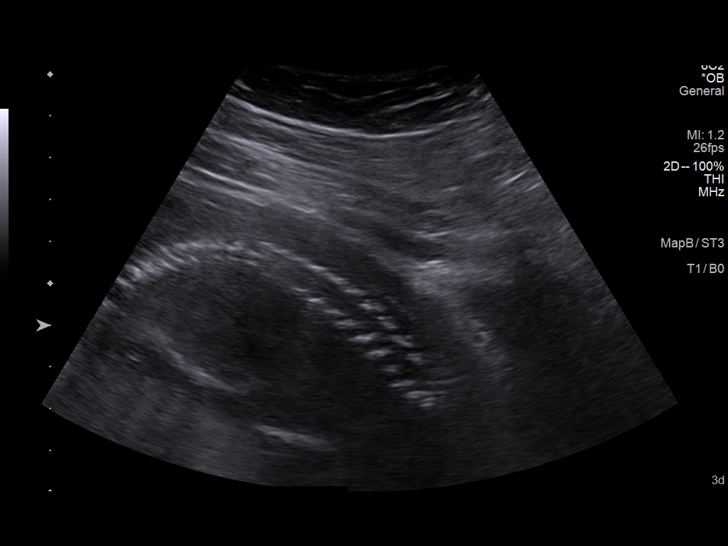
[im 28/84]
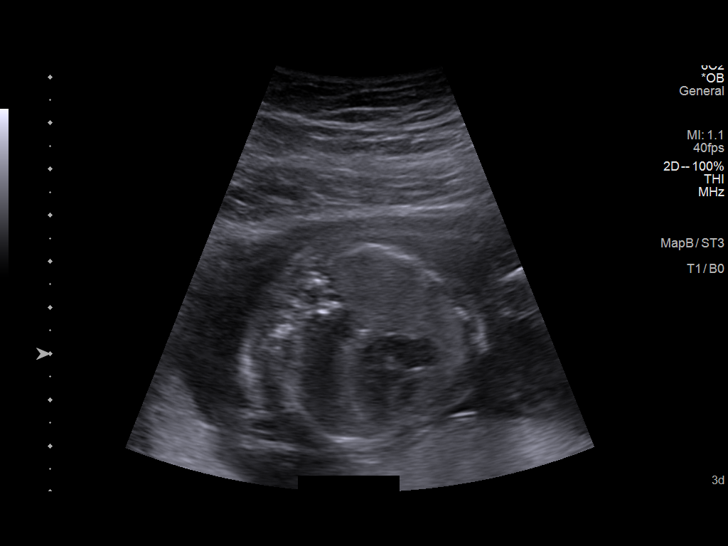
[im 34/84]
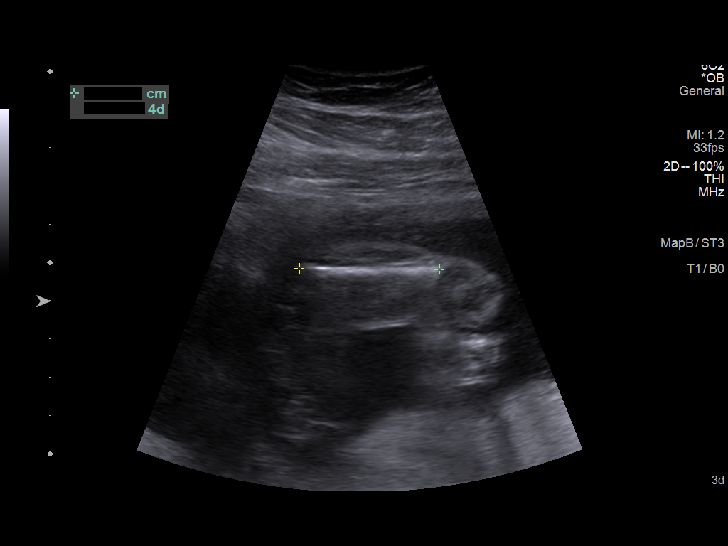
[im 44/84]
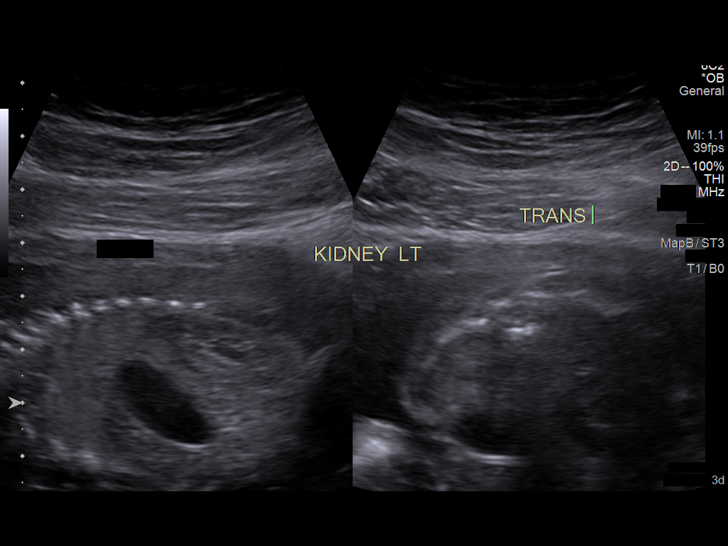
[im 50/84]
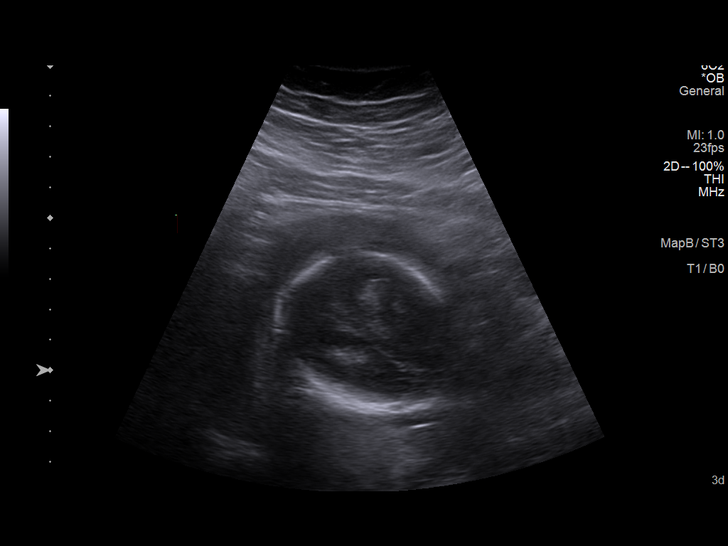
[im 56/84]
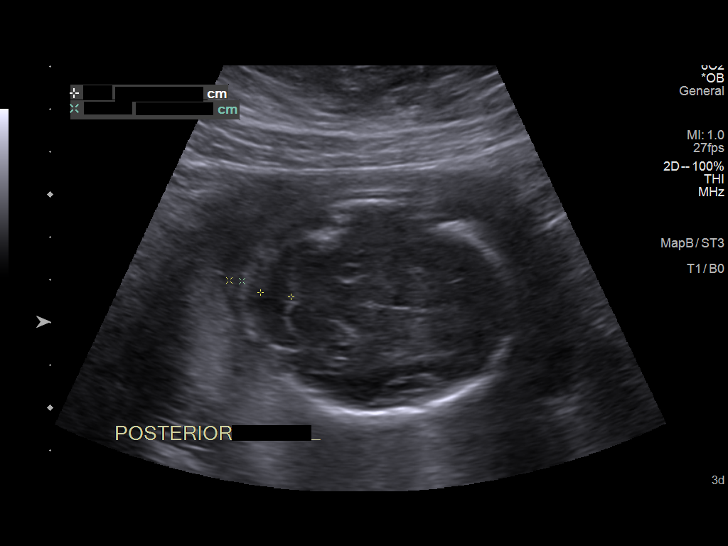
[im 62/84]
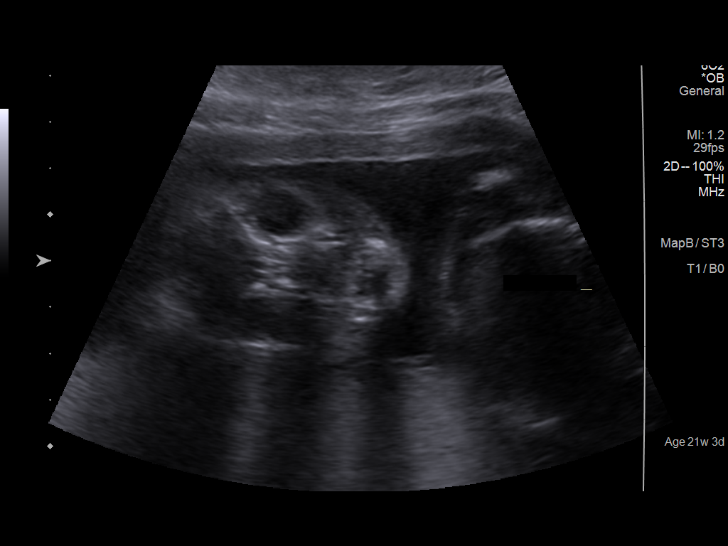
[im 68/84]
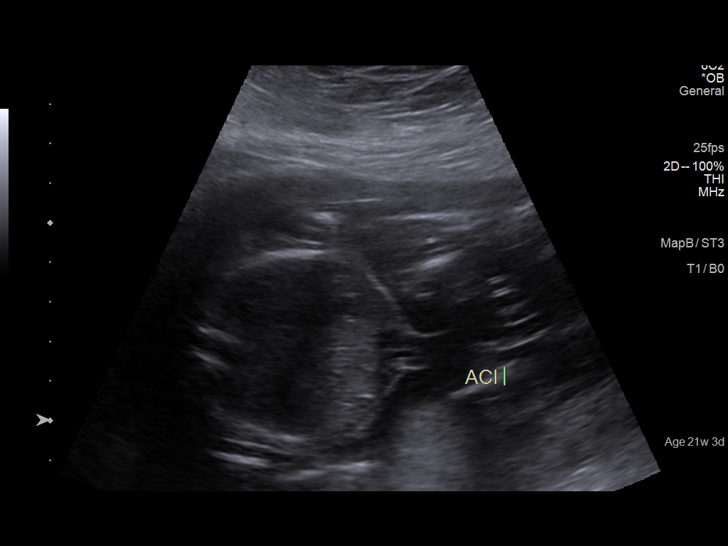
[im 74/84]
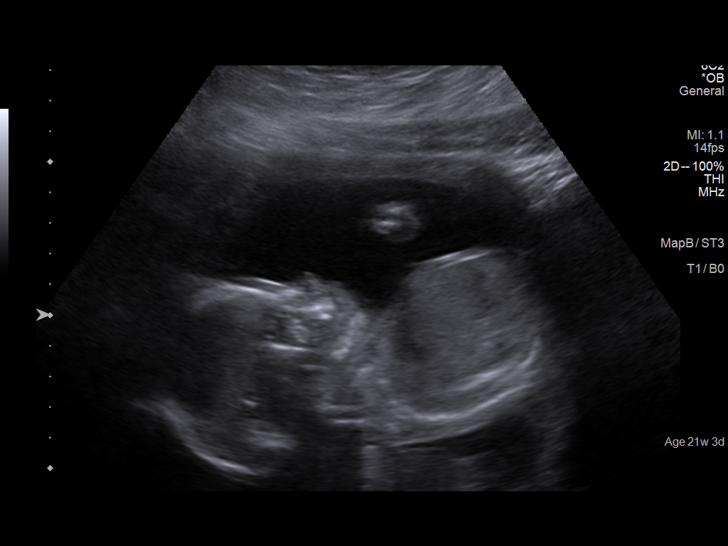
[im 80/84]
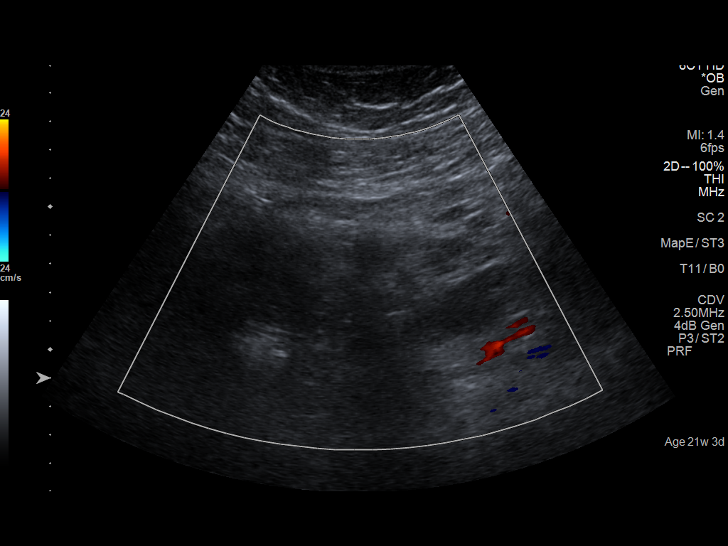

[13 of 28 positions shown; findings below may reference images not displayed]

FINDINGS: Number of Fetuses: 1

Heart Rate:  140 bpm

Movement: Yes

Presentation: Breech

Previa: No

Placental Location: Posterior

Amniotic Fluid (Subjective): Within normal limits

Amniotic Fluid (Objective):

Vertical pocket = 5.8cm

FETAL BIOMETRY

BPD: 5.0cm 21w 0d

HC:   18.8cm 21w 1d

AC:   15.5cm 20w 4d

FL:   3.6cm 21w 2d

Current Mean GA: 21w 0d US EDC: 09/06/2020

Assigned GA:  21w 3d Assigned EDC: 09/03/2020

FETAL ANATOMY

Lateral Ventricles: Appears normal

Thalami/CSP: Appears normal

Posterior Fossa:  Appears normal

Nuchal Region: Appears normal   NFT= N/A > 20 WKS

Upper Lip: Appears normal

Spine: Appears normal

4 Chamber Heart on Left: Appears normal

LVOT: Appears normal

RVOT: Appears normal

Stomach on Left: Appears normal

3 Vessel Cord: Appears normal

Cord Insertion site: Appears normal

Kidneys: Appears normal

Bladder: Appears normal

Extremities: Appears normal

Technically difficult due to: Maternal habitus and fetal position

Maternal Findings:

Cervix:  4.0 cm TA
IMPRESSION: Assigned GA currently 21 weeks 3 days.  Appropriate fetal growth.

Unremarkable fetal anatomic survey.  No fetal anomalies identified.

## 2021-06-07 ENCOUNTER — Ambulatory Visit (INDEPENDENT_AMBULATORY_CARE_PROVIDER_SITE_OTHER): Payer: Self-pay

## 2021-06-07 DIAGNOSIS — Z3480 Encounter for supervision of other normal pregnancy, unspecified trimester: Secondary | ICD-10-CM | POA: Insufficient documentation

## 2021-06-07 DIAGNOSIS — Z3200 Encounter for pregnancy test, result unknown: Secondary | ICD-10-CM | POA: Diagnosis not present

## 2021-06-07 DIAGNOSIS — Z3689 Encounter for other specified antenatal screening: Secondary | ICD-10-CM

## 2021-06-07 NOTE — Initial Assessments (Signed)
New OB Intake ? ?I connected with  Vanessa Thornton on 06/07/21 at 10:15 AM EDT by telephone Telephone Visit and verified that I am speaking with the correct person using two identifiers. Nurse is located at [facility] and pt is located at home. ? ?I explained I am completing New OB Intake today. We discussed her EDD of 01/07/2022 that is based on LMP of 04/02/2021. Pt is G2/P1. I reviewed her allergies, medications, Medical/Surgical/OB history, and appropriate screenings. I informed her of [facility] services. Based on history, this is a/an  pregnancy uncomplicated .  ? ?Patient Active Problem List  ? Diagnosis Date Noted  ? Supervision of other normal pregnancy, antepartum 06/07/2021  ? Postpartum care following vaginal delivery 08/28/2020  ? Encounter for care or examination of lactating mother 08/28/2020  ? [redacted] weeks gestation of pregnancy   ? Encounter for elective induction of labor 08/27/2020  ? Anxiety   ? Depression   ? Decreased fetal movements in third trimester   ? Supervision of high risk pregnancy, antepartum 01/07/2020  ? Obesity affecting pregnancy in first trimester 01/07/2020  ? Family history of ovarian cancer 05/18/2019  ? ? ?Concerns addressed today ?Pt states her LMP is the first on she's had since she has been breastfeeding.  She is currently breastfeeding. ? ?Delivery Plans:  ?Plans to deliver at [facility] L&D.  ? ? [place] ?Explained first scheduled [place] will be scheduled at her first visit with provider. ? ?Labs ?Pt aware NOB labs will be drawn at first visit with provider. ? ?Patient has covid vaccine.  Pt has not had the second dose.   ? ?Social Determinants of Health ?Food Insecurity: Patient denies food insecurity.  ?Transportation: Patient denies transportation needs. ? ?Placed OB Box on problem list and updated ? ?First visit review ?I reviewed new OB appt with pt. I explained she will have a pelvic exam, ob bloodwork with genetic screening, and PAP smear. Explained pt  will be seen by Tresea Mall, CNM at first visit; encounter routed to appropriate provider.   ? ?Loran Senters, [facility]) ?06/07/2021  10:12 AM  ?

## 2021-06-14 ENCOUNTER — Encounter: Payer: Self-pay | Admitting: Advanced Practice Midwife

## 2021-06-14 DIAGNOSIS — Z3201 Encounter for pregnancy test, result positive: Secondary | ICD-10-CM | POA: Diagnosis not present

## 2021-06-18 ENCOUNTER — Encounter: Payer: Commercial Managed Care - PPO | Admitting: Advanced Practice Midwife

## 2021-06-29 DIAGNOSIS — Z3689 Encounter for other specified antenatal screening: Secondary | ICD-10-CM | POA: Diagnosis not present

## 2021-06-29 DIAGNOSIS — Z3481 Encounter for supervision of other normal pregnancy, first trimester: Secondary | ICD-10-CM | POA: Diagnosis not present

## 2021-06-29 DIAGNOSIS — Z36 Encounter for antenatal screening for chromosomal anomalies: Secondary | ICD-10-CM | POA: Diagnosis not present

## 2021-06-29 DIAGNOSIS — Z124 Encounter for screening for malignant neoplasm of cervix: Secondary | ICD-10-CM | POA: Diagnosis not present

## 2021-07-02 LAB — OB RESULTS CONSOLE HIV ANTIBODY (ROUTINE TESTING): HIV: NONREACTIVE

## 2021-07-02 LAB — OB RESULTS CONSOLE HEPATITIS B SURFACE ANTIGEN: Hepatitis B Surface Ag: NEGATIVE

## 2021-07-02 LAB — OB RESULTS CONSOLE RUBELLA ANTIBODY, IGM: Rubella: IMMUNE

## 2021-07-02 LAB — OB RESULTS CONSOLE RPR: RPR: NONREACTIVE

## 2021-07-03 LAB — OB RESULTS CONSOLE GC/CHLAMYDIA
Chlamydia: NEGATIVE
Neisseria Gonorrhea: NEGATIVE

## 2021-08-03 DIAGNOSIS — Z361 Encounter for antenatal screening for raised alphafetoprotein level: Secondary | ICD-10-CM | POA: Diagnosis not present

## 2021-08-03 DIAGNOSIS — N898 Other specified noninflammatory disorders of vagina: Secondary | ICD-10-CM | POA: Diagnosis not present

## 2021-08-29 DIAGNOSIS — Z363 Encounter for antenatal screening for malformations: Secondary | ICD-10-CM | POA: Diagnosis not present

## 2021-09-13 DIAGNOSIS — Z362 Encounter for other antenatal screening follow-up: Secondary | ICD-10-CM | POA: Diagnosis not present

## 2021-10-30 DIAGNOSIS — Z3A27 27 weeks gestation of pregnancy: Secondary | ICD-10-CM | POA: Diagnosis not present

## 2021-10-30 DIAGNOSIS — O99212 Obesity complicating pregnancy, second trimester: Secondary | ICD-10-CM | POA: Diagnosis not present

## 2021-10-30 DIAGNOSIS — O4442 Low lying placenta NOS or without hemorrhage, second trimester: Secondary | ICD-10-CM | POA: Diagnosis not present

## 2021-10-30 DIAGNOSIS — Z3689 Encounter for other specified antenatal screening: Secondary | ICD-10-CM | POA: Diagnosis not present

## 2021-11-14 DIAGNOSIS — Z23 Encounter for immunization: Secondary | ICD-10-CM | POA: Diagnosis not present

## 2021-11-14 DIAGNOSIS — Z3689 Encounter for other specified antenatal screening: Secondary | ICD-10-CM | POA: Diagnosis not present

## 2021-11-15 LAB — OB RESULTS CONSOLE RPR: RPR: NONREACTIVE

## 2021-11-29 DIAGNOSIS — O99213 Obesity complicating pregnancy, third trimester: Secondary | ICD-10-CM | POA: Diagnosis not present

## 2021-11-29 DIAGNOSIS — O133 Gestational [pregnancy-induced] hypertension without significant proteinuria, third trimester: Secondary | ICD-10-CM | POA: Diagnosis not present

## 2021-11-29 DIAGNOSIS — R030 Elevated blood-pressure reading, without diagnosis of hypertension: Secondary | ICD-10-CM | POA: Diagnosis not present

## 2021-11-29 DIAGNOSIS — Z3A32 32 weeks gestation of pregnancy: Secondary | ICD-10-CM | POA: Diagnosis not present

## 2021-12-03 DIAGNOSIS — Z3A32 32 weeks gestation of pregnancy: Secondary | ICD-10-CM | POA: Diagnosis not present

## 2021-12-03 DIAGNOSIS — O133 Gestational [pregnancy-induced] hypertension without significant proteinuria, third trimester: Secondary | ICD-10-CM | POA: Diagnosis not present

## 2021-12-06 NOTE — Telephone Encounter (Signed)
Created in error

## 2021-12-10 DIAGNOSIS — Z3A33 33 weeks gestation of pregnancy: Secondary | ICD-10-CM | POA: Diagnosis not present

## 2021-12-10 DIAGNOSIS — O133 Gestational [pregnancy-induced] hypertension without significant proteinuria, third trimester: Secondary | ICD-10-CM | POA: Diagnosis not present

## 2021-12-18 DIAGNOSIS — Z3A34 34 weeks gestation of pregnancy: Secondary | ICD-10-CM | POA: Diagnosis not present

## 2021-12-18 DIAGNOSIS — O133 Gestational [pregnancy-induced] hypertension without significant proteinuria, third trimester: Secondary | ICD-10-CM | POA: Diagnosis not present

## 2021-12-25 DIAGNOSIS — O3663X0 Maternal care for excessive fetal growth, third trimester, not applicable or unspecified: Secondary | ICD-10-CM | POA: Diagnosis not present

## 2021-12-25 DIAGNOSIS — O133 Gestational [pregnancy-induced] hypertension without significant proteinuria, third trimester: Secondary | ICD-10-CM | POA: Diagnosis not present

## 2021-12-25 DIAGNOSIS — Z3A35 35 weeks gestation of pregnancy: Secondary | ICD-10-CM | POA: Diagnosis not present

## 2021-12-25 DIAGNOSIS — Z23 Encounter for immunization: Secondary | ICD-10-CM | POA: Diagnosis not present

## 2021-12-25 DIAGNOSIS — Z3689 Encounter for other specified antenatal screening: Secondary | ICD-10-CM | POA: Diagnosis not present

## 2021-12-27 DIAGNOSIS — Z0371 Encounter for suspected problem with amniotic cavity and membrane ruled out: Secondary | ICD-10-CM | POA: Diagnosis not present

## 2021-12-28 ENCOUNTER — Other Ambulatory Visit: Payer: Self-pay

## 2021-12-28 ENCOUNTER — Inpatient Hospital Stay (HOSPITAL_COMMUNITY)
Admission: AD | Admit: 2021-12-28 | Discharge: 2021-12-31 | DRG: 806 | Disposition: A | Discharge status: Home / Self Care | Payer: BC Managed Care – PPO | Attending: Obstetrics & Gynecology | Admitting: Obstetrics & Gynecology

## 2021-12-28 ENCOUNTER — Encounter (HOSPITAL_COMMUNITY): Payer: Self-pay | Admitting: Obstetrics and Gynecology

## 2021-12-28 DIAGNOSIS — Z3A36 36 weeks gestation of pregnancy: Secondary | ICD-10-CM | POA: Diagnosis not present

## 2021-12-28 DIAGNOSIS — O43893 Other placental disorders, third trimester: Secondary | ICD-10-CM | POA: Diagnosis not present

## 2021-12-28 DIAGNOSIS — Z87891 Personal history of nicotine dependence: Secondary | ICD-10-CM

## 2021-12-28 DIAGNOSIS — F419 Anxiety disorder, unspecified: Secondary | ICD-10-CM | POA: Diagnosis not present

## 2021-12-28 DIAGNOSIS — Z880 Allergy status to penicillin: Secondary | ICD-10-CM | POA: Diagnosis not present

## 2021-12-28 DIAGNOSIS — O42913 Preterm premature rupture of membranes, unspecified as to length of time between rupture and onset of labor, third trimester: Principal | ICD-10-CM | POA: Diagnosis present

## 2021-12-28 DIAGNOSIS — Z412 Encounter for routine and ritual male circumcision: Secondary | ICD-10-CM | POA: Diagnosis not present

## 2021-12-28 DIAGNOSIS — O9902 Anemia complicating childbirth: Secondary | ICD-10-CM | POA: Diagnosis not present

## 2021-12-28 DIAGNOSIS — O9081 Anemia of the puerperium: Secondary | ICD-10-CM | POA: Diagnosis not present

## 2021-12-28 DIAGNOSIS — F32A Depression, unspecified: Secondary | ICD-10-CM | POA: Diagnosis present

## 2021-12-28 DIAGNOSIS — D62 Acute posthemorrhagic anemia: Secondary | ICD-10-CM | POA: Diagnosis not present

## 2021-12-28 DIAGNOSIS — O99344 Other mental disorders complicating childbirth: Secondary | ICD-10-CM | POA: Diagnosis present

## 2021-12-28 DIAGNOSIS — O3663X0 Maternal care for excessive fetal growth, third trimester, not applicable or unspecified: Secondary | ICD-10-CM | POA: Diagnosis present

## 2021-12-28 DIAGNOSIS — O42919 Preterm premature rupture of membranes, unspecified as to length of time between rupture and onset of labor, unspecified trimester: Principal | ICD-10-CM

## 2021-12-28 DIAGNOSIS — Z3A00 Weeks of gestation of pregnancy not specified: Secondary | ICD-10-CM | POA: Diagnosis not present

## 2021-12-28 DIAGNOSIS — F418 Other specified anxiety disorders: Secondary | ICD-10-CM | POA: Diagnosis not present

## 2021-12-28 LAB — COMPREHENSIVE METABOLIC PANEL
ALT: 16 U/L (ref 0–44)
AST: 24 U/L (ref 15–41)
Albumin: 2.7 g/dL — ABNORMAL LOW (ref 3.5–5.0)
Alkaline Phosphatase: 143 U/L — ABNORMAL HIGH (ref 38–126)
Anion gap: 10 (ref 5–15)
BUN: <5 mg/dL — ABNORMAL LOW (ref 6–20)
CO2: 20 mmol/L — ABNORMAL LOW (ref 22–32)
Calcium: 8.9 mg/dL (ref 8.9–10.3)
Chloride: 108 mmol/L (ref 98–111)
Creatinine, Ser: 0.57 mg/dL (ref 0.44–1.00)
GFR, Estimated: >60 mL/min (ref >60)
Glucose, Bld: 79 mg/dL (ref 70–99)
Potassium: 3.8 mmol/L (ref 3.5–5.1)
Sodium: 138 mmol/L (ref 135–145)
Total Bilirubin: 0.4 mg/dL (ref 0.3–1.2)
Total Protein: 7 g/dL (ref 6.5–8.1)

## 2021-12-28 LAB — CBC
HCT: 38.9 % (ref 36.0–46.0)
Hemoglobin: 12.3 g/dL (ref 12.0–15.0)
MCH: 27 pg (ref 26.0–34.0)
MCHC: 31.6 g/dL (ref 30.0–36.0)
MCV: 85.5 fL (ref 80.0–100.0)
Platelets: 206 10*3/uL (ref 150–400)
RBC: 4.55 MIL/uL (ref 3.87–5.11)
RDW: 16 % — ABNORMAL HIGH (ref 11.5–15.5)
WBC: 7.9 10*3/uL (ref 4.0–10.5)
nRBC: 0 % (ref 0.0–0.2)

## 2021-12-28 LAB — TYPE AND SCREEN
ABO/RH(D): O POS
Antibody Screen: NEGATIVE

## 2021-12-28 LAB — AMNISURE RUPTURE OF MEMBRANE (ROM) NOT AT ARMC: Amnisure ROM: POSITIVE

## 2021-12-28 MED ORDER — ONDANSETRON HCL 4 MG/2ML IJ SOLN: 4.0000 mg | Freq: Four times a day (QID) | INTRAMUSCULAR | Status: DC | PRN

## 2021-12-28 MED ORDER — FENTANYL CITRATE (PF) 100 MCG/2ML IJ SOLN
100.0000 ug | INTRAMUSCULAR | Status: DC | PRN
  Administered 2021-12-28 – 2021-12-29 (×2): 100 ug via INTRAVENOUS
  Filled 2021-12-28 (×2): qty 2

## 2021-12-28 MED ORDER — ACETAMINOPHEN 325 MG PO TABS: 650.0000 mg | ORAL_TABLET | ORAL | Status: DC | PRN

## 2021-12-28 MED ORDER — OXYTOCIN BOLUS FROM INFUSION
333.0000 mL | Freq: Once | INTRAVENOUS | Status: AC
  Administered 2021-12-29: 333 mL via INTRAVENOUS

## 2021-12-28 MED ORDER — LIDOCAINE HCL (PF) 1 % IJ SOLN: 30.0000 mL | INTRAMUSCULAR | Status: DC | PRN

## 2021-12-28 MED ORDER — LACTATED RINGERS IV SOLN
500.0000 mL | INTRAVENOUS | Status: DC | PRN
  Administered 2021-12-29: 500 mL via INTRAVENOUS

## 2021-12-28 MED ORDER — OXYCODONE-ACETAMINOPHEN 5-325 MG PO TABS: 1.0000 | ORAL_TABLET | ORAL | Status: DC | PRN

## 2021-12-28 MED ORDER — OXYCODONE-ACETAMINOPHEN 5-325 MG PO TABS: 2.0000 | ORAL_TABLET | ORAL | Status: DC | PRN

## 2021-12-28 MED ORDER — OXYTOCIN-SODIUM CHLORIDE 30-0.9 UT/500ML-% IV SOLN: 2.5000 [IU]/h | INTRAVENOUS | Status: DC

## 2021-12-28 MED ORDER — SOD CITRATE-CITRIC ACID 500-334 MG/5ML PO SOLN: 30.0000 mL | ORAL | Status: DC | PRN

## 2021-12-28 MED ORDER — LACTATED RINGERS IV SOLN: INTRAVENOUS | Status: DC

## 2021-12-28 MED ORDER — VANCOMYCIN HCL 10 G IV SOLR
2000.0000 mg | Freq: Three times a day (TID) | INTRAVENOUS | Status: DC
  Administered 2021-12-29 (×2): 2000 mg via INTRAVENOUS
  Filled 2021-12-28: qty 40
  Filled 2021-12-28: qty 20
  Filled 2021-12-28: qty 40

## 2021-12-28 NOTE — H&P (Signed)
Julicia Krieger is a 26 y.o. female G2P1001 [redacted]w[redacted]d presenting for LOF. Patient reports some off and on increased clear watery discharge since Tuesday evening (3 nights prior) but never had any large or continuous gushes. She was seen in the office yesterday where she had ROM evaluation that was inconclusive with positive nitrazine but negative ferning and negative pooling. She was found to be 3 cm dilation at that time. Today patient reported continued intermittent leaking and was advised to come to MAU where Amnisure test was found to be positive. During MAU evaluation patient reports increased frequency/intensity of contractions. No VB. Good FM throughout.  Pregnancy is dated by LMP c/w 8w sono. She has received routine prenatal care at [facility] with Dr. Amado Nash as primary. She had routine prenatal labs WNL. She had a low risk NIPT and negative AFP1 screen. She had a normal fetal anatomy scan with low lying placenta that resolved by 28w. She had a normal early and routine 1hr GTT screen. In the third trimester around 27 weeks, patient developed some mild range elevated Bps meeting criteria for gHTN but labile since then, not requiring medication. PIH labs have been normal in the office and remains asymptomatic. Most recent growth [place] at 35.6 showed vertex presentation with EFW 8#4 (99%) with BPD/HC/AC all at 99%. History of prior SVD with pelvis proven to 8#2, complicated by PPH requiring 2U PRBC transfusion. Patient's current GBS result from office pending, but did have +GBS in last pregnancy resistant to Clindamycin and received Vancomycin for hives allergy to PCN. PMH significant for anxiety, managed with Citalopram.   Patient is accompanied by her husband for labor support. They are expecting a baby boy.  OB History     Gravida  2   Para  1   Term  1   Preterm  0   AB  0   Living  1      SAB  0   IAB  0   Ectopic  0   Multiple  0   Live Births  1          Past Medical  History:  Diagnosis Date   [redacted] weeks gestation of pregnancy    Anxiety    Depression    PVC (premature ventricular contraction)    Past Surgical History:  Procedure Laterality Date   WISDOM TOOTH EXTRACTION     Family History: family history includes Heart attack in her paternal grandfather; Ovarian cancer in her maternal aunt. Social History:  reports that she has quit smoking. Her smoking use included cigarettes. She has never used smokeless tobacco. She reports that she does not drink alcohol and does not use drugs.     Maternal Diabetes: No Genetic Screening: Normal Maternal Ultrasounds/Referrals: Normal Fetal Ultrasounds or other Referrals:  None Maternal Substance Abuse:  No Significant Maternal Medications:  Meds include: Other: Citalopram Significant Maternal Lab Results:  None Other Comments:  None  Review of Systems  All other systems reviewed and are negative.  Per HPI Maternal Exam:  Uterine Assessment: Contraction frequency is regular.  Abdomen: Fetal presentation: vertex Introitus: Amniotic fluid character: clear.   Fetal Exam Fetal Monitor Review: Baseline rate: 130.  Variability: moderate (6-25 bpm).   Pattern: accelerations present and no decelerations.   Fetal State Assessment: Category I - tracings are normal.   Physical Exam Vitals reviewed.  Constitutional:      Appearance: She is obese.  HENT:     Head: Normocephalic.  Cardiovascular:  Rate and Rhythm: Normal rate.  Pulmonary:     Effort: Pulmonary effort is normal.  Abdominal:     Tenderness: There is no abdominal tenderness.  Musculoskeletal:        General: Normal range of motion.     Cervical back: Normal range of motion.  Skin:    General: Skin is warm and dry.  Neurological:     General: No focal deficit present.     Mental Status: She is alert and oriented to person, place, and time.  Psychiatric:        Mood and Affect: Mood normal.        Behavior: Behavior normal.      Dilation: 3.5 Effacement (%): 50 Station: -2 Exam by:: courtney hernandez,rn Blood pressure 139/65, pulse 96, temperature 98.9 F (37.2 C), temperature source Oral, resp. rate 20, height 5\' 6"  (1.676 m), weight 125 kg, last menstrual period 04/02/2021, currently breastfeeding.  Prenatal labs: ABO, Rh:  --/--/O POS (12/01 2128) Antibody: NEG (12/01 2128) Rubella:  Immune RPR:   Non-reactive HBsAg:   Negative HIV:   Non-reactive GBS:   Pending  Chemistry Recent Labs  Lab 12/28/21 2130  NA 138  K 3.8  CL 108  CO2 20*  GLUCOSE 79  BUN <5*  CREATININE 0.57  CALCIUM 8.9  GFRNONAA >60  ANIONGAP 10    Recent Labs  Lab 12/28/21 2130  PROT 7.0  ALBUMIN 2.7*  AST 24  ALT 16  ALKPHOS 143*  BILITOT 0.4   Hematology Recent Labs  Lab 12/28/21 2130  WBC 7.9  RBC 4.55  HGB 12.3  HCT 38.9  MCV 85.5  MCH 27.0  MCHC 31.6  RDW 16.0*  PLT 206   Cardiac EnzymesNo results for input(s): "TROPONINI" in the last 168 hours. No results for input(s): "TROPIPOC" in the last 168 hours.  BNPNo results for input(s): "BNP", "PROBNP" in the last 168 hours.  DDimer No results for input(s): "DDIMER" in the last 168 hours.   Assessment/Plan: Meher Kucinski is a 26 y.o. female G2P1001 [redacted]w[redacted]d admitted with PPROM.    -Admit to LD -Routine admission labs -PPROM with unknown duration, history of +GBS Clinda resistant in previous pregnancy with rapid GBS pending. Will start Vancomycin per protocol now, may discontinue if rapid PCR negative -Expectant management until antibiotics started, patient becoming more uncomfortable but consider Pitocin if no cervical change once admitted on LD -Late preterm, no BMZ indicated -gHTN: normal Bps without meds, PIH labs added for admission, cont to monitor  -Patient may have epidural on request -Hx PPH, proactive mgmt -LGA, pelvis proven to 8#2 -Rh POS, Rubella Imm -Anxiety: cont home Citalopram -Routine intrapartum care -Anticipate  SVD  Sam Overbeck A Vineeth Fell 12/28/2021, 11:58 PM

## 2021-12-28 NOTE — MAU Note (Signed)
Pt says EDC is wrong-  EDC is 01-23-22- by her office  -therefore  36.2 days  On Wed had watery D/C- so messaged Dr on [facility]- went to office-  saw Dr. Amado Nash - fern was neg, another test was positive .  Told to rest. Then was called today- asked question. Told to come have fluid level checked  Pt says has wetness on pad , not running down legs.  Feels lower abd cramps- started Wed

## 2021-12-29 ENCOUNTER — Inpatient Hospital Stay (HOSPITAL_COMMUNITY): Payer: BC Managed Care – PPO | Admitting: Anesthesiology

## 2021-12-29 ENCOUNTER — Encounter (HOSPITAL_COMMUNITY): Payer: Self-pay | Admitting: Obstetrics and Gynecology

## 2021-12-29 LAB — CBC
HCT: 32.5 % — ABNORMAL LOW (ref 36.0–46.0)
Hemoglobin: 10.6 g/dL — ABNORMAL LOW (ref 12.0–15.0)
MCH: 27.3 pg (ref 26.0–34.0)
MCHC: 32.6 g/dL (ref 30.0–36.0)
MCV: 83.8 fL (ref 80.0–100.0)
Platelets: 165 10*3/uL (ref 150–400)
RBC: 3.88 MIL/uL (ref 3.87–5.11)
RDW: 15.9 % — ABNORMAL HIGH (ref 11.5–15.5)
WBC: 8.5 10*3/uL (ref 4.0–10.5)
nRBC: 0 % (ref 0.0–0.2)

## 2021-12-29 LAB — RPR: RPR Ser Ql: NONREACTIVE

## 2021-12-29 LAB — GROUP B STREP BY PCR: Group B strep by PCR: POSITIVE — AB

## 2021-12-29 MED ORDER — DIPHENHYDRAMINE HCL 50 MG/ML IJ SOLN
12.5000 mg | INTRAMUSCULAR | Status: DC | PRN
  Administered 2021-12-29: 12.5 mg via INTRAVENOUS
  Filled 2021-12-29: qty 1

## 2021-12-29 MED ORDER — CITALOPRAM HYDROBROMIDE 20 MG PO TABS
20.0000 mg | ORAL_TABLET | Freq: Every day | ORAL | Status: DC
  Filled 2021-12-29: qty 1

## 2021-12-29 MED ORDER — DIBUCAINE (PERIANAL) 1 % EX OINT: 1.0000 | TOPICAL_OINTMENT | CUTANEOUS | Status: DC | PRN

## 2021-12-29 MED ORDER — COCONUT OIL OIL
1.0000 | TOPICAL_OIL | Status: DC | PRN
  Administered 2021-12-29: 1 via TOPICAL

## 2021-12-29 MED ORDER — CITALOPRAM HYDROBROMIDE 20 MG PO TABS
30.0000 mg | ORAL_TABLET | Freq: Every day | ORAL | Status: DC
  Administered 2021-12-29 – 2021-12-31 (×3): 30 mg via ORAL
  Filled 2021-12-29 (×3): qty 2

## 2021-12-29 MED ORDER — TRANEXAMIC ACID-NACL 1000-0.7 MG/100ML-% IV SOLN
1000.0000 mg | Freq: Once | INTRAVENOUS | Status: AC | PRN
  Administered 2021-12-29: 1000 mg via INTRAVENOUS
  Filled 2021-12-29: qty 100

## 2021-12-29 MED ORDER — WITCH HAZEL-GLYCERIN EX PADS: 1.0000 | MEDICATED_PAD | CUTANEOUS | Status: DC | PRN

## 2021-12-29 MED ORDER — ONDANSETRON HCL 4 MG/2ML IJ SOLN: 4.0000 mg | INTRAMUSCULAR | Status: DC | PRN

## 2021-12-29 MED ORDER — LACTATED RINGERS IV SOLN: 500.0000 mL | Freq: Once | INTRAVENOUS | Status: DC

## 2021-12-29 MED ORDER — SIMETHICONE 80 MG PO CHEW: 80.0000 mg | CHEWABLE_TABLET | ORAL | Status: DC | PRN

## 2021-12-29 MED ORDER — PHENYLEPHRINE 80 MCG/ML (10ML) SYRINGE FOR IV PUSH (FOR BLOOD PRESSURE SUPPORT)
80.0000 ug | PREFILLED_SYRINGE | INTRAVENOUS | Status: DC | PRN
  Administered 2021-12-29: 80 ug via INTRAVENOUS

## 2021-12-29 MED ORDER — LIDOCAINE-EPINEPHRINE (PF) 2 %-1:200000 IJ SOLN
INTRAMUSCULAR | Status: DC | PRN
  Administered 2021-12-29: 3 mL via EPIDURAL

## 2021-12-29 MED ORDER — BENZOCAINE-MENTHOL 20-0.5 % EX AERO
1.0000 | INHALATION_SPRAY | CUTANEOUS | Status: DC | PRN
  Administered 2021-12-29: 1 via TOPICAL
  Filled 2021-12-29: qty 56

## 2021-12-29 MED ORDER — ZOLPIDEM TARTRATE 5 MG PO TABS: 5.0000 mg | ORAL_TABLET | Freq: Every evening | ORAL | Status: DC | PRN

## 2021-12-29 MED ORDER — EPHEDRINE 5 MG/ML INJ: 10.0000 mg | INTRAVENOUS | Status: DC | PRN

## 2021-12-29 MED ORDER — IBUPROFEN 600 MG PO TABS
600.0000 mg | ORAL_TABLET | Freq: Four times a day (QID) | ORAL | Status: DC
  Administered 2021-12-29 – 2021-12-31 (×9): 600 mg via ORAL
  Filled 2021-12-29 (×9): qty 1

## 2021-12-29 MED ORDER — DIPHENHYDRAMINE HCL 25 MG PO CAPS: 25.0000 mg | ORAL_CAPSULE | Freq: Four times a day (QID) | ORAL | Status: DC | PRN

## 2021-12-29 MED ORDER — TERBUTALINE SULFATE 1 MG/ML IJ SOLN: 0.2500 mg | Freq: Once | INTRAMUSCULAR | Status: DC | PRN

## 2021-12-29 MED ORDER — PRENATAL MULTIVITAMIN CH
1.0000 | ORAL_TABLET | Freq: Every day | ORAL | Status: DC
  Administered 2021-12-30 – 2021-12-31 (×2): 1 via ORAL
  Filled 2021-12-29 (×2): qty 1

## 2021-12-29 MED ORDER — SENNOSIDES-DOCUSATE SODIUM 8.6-50 MG PO TABS
2.0000 | ORAL_TABLET | Freq: Every day | ORAL | Status: DC
  Administered 2021-12-30 – 2021-12-31 (×2): 2 via ORAL
  Filled 2021-12-29 (×2): qty 2

## 2021-12-29 MED ORDER — BUPIVACAINE HCL (PF) 0.25 % IJ SOLN
INTRAMUSCULAR | Status: DC | PRN
  Administered 2021-12-29 (×2): 3 mL via EPIDURAL

## 2021-12-29 MED ORDER — ACETAMINOPHEN 325 MG PO TABS: 650.0000 mg | ORAL_TABLET | ORAL | Status: DC | PRN

## 2021-12-29 MED ORDER — ONDANSETRON HCL 4 MG PO TABS: 4.0000 mg | ORAL_TABLET | ORAL | Status: DC | PRN

## 2021-12-29 MED ORDER — TETANUS-DIPHTH-ACELL PERTUSSIS 5-2.5-18.5 LF-MCG/0.5 IM SUSY: 0.5000 mL | PREFILLED_SYRINGE | Freq: Once | INTRAMUSCULAR | Status: DC

## 2021-12-29 MED ORDER — PHENYLEPHRINE 80 MCG/ML (10ML) SYRINGE FOR IV PUSH (FOR BLOOD PRESSURE SUPPORT)
80.0000 ug | PREFILLED_SYRINGE | INTRAVENOUS | Status: DC | PRN
  Filled 2021-12-29: qty 10

## 2021-12-29 MED ORDER — OXYTOCIN-SODIUM CHLORIDE 30-0.9 UT/500ML-% IV SOLN
1.0000 m[IU]/min | INTRAVENOUS | Status: DC
  Administered 2021-12-29: 2 m[IU]/min via INTRAVENOUS
  Filled 2021-12-29: qty 500

## 2021-12-29 MED ORDER — FENTANYL-BUPIVACAINE-NACL 0.5-0.125-0.9 MG/250ML-% EP SOLN
12.0000 mL/h | EPIDURAL | Status: DC | PRN
  Administered 2021-12-29: 12 mL/h via EPIDURAL
  Filled 2021-12-29: qty 250

## 2021-12-29 NOTE — Lactation Note (Addendum)
This note was copied from a baby's chart. Lactation Consultation Note  Patient Name: Vanessa Thornton [id number] Date: 12/29/2021 Reason for consult: L&D Initial assessment Age:26 hours  P2, Baby [redacted]w[redacted]d.  Mother states she thinks per her cycle that infant's GA may be 2 weeks longer.  Suggest discussing it with MD.  Pecola Leisure latched upon entering with lips flanged and intermittent swallows.  Discussed breastfeeding other child but breastfeeding newborn first.   Feed on demand with cues.  Goal 8-12+ times per day after first 24 hrs.  Place baby STS if not cueing.  Briefly discussed LPI feeding plan.   Maternal Data Has patient been taught Hand Expression?: Yes Does the patient have breastfeeding experience prior to this delivery?: Yes How long did the patient breastfeed?:  (still breastfeeding 16 mos. old child at home once per day)  Feeding Mother's Current Feeding Choice: Breast Milk  LATCH Score Latch: Repeated attempts needed to sustain latch, nipple held in mouth throughout feeding, stimulation needed to elicit sucking reflex.  Audible Swallowing: A few with stimulation  Type of Nipple: Everted at rest and after stimulation  Comfort (Breast/Nipple): Soft / non-tender  Hold (Positioning): Assistance needed to correctly position infant at breast and maintain latch.  LATCH Score: 7   Lactation Tools Discussed/Used    Interventions Interventions: Education  Discharge    Consult Status Consult Status: Follow-up from L&D    Dahlia Byes [facility] 12/29/2021, 10:31 AM

## 2021-12-29 NOTE — Lactation Note (Signed)
This note was copied from a baby's chart. Lactation Consultation Note  Patient Name: Vanessa Thornton [id number] Date: 12/29/2021   Age:26 hours  LC attempted to visit with the birth parent, but the lab was entering the room to draw blood. Lactation will follow up later.  Maternal Data    Feeding    LATCH Score Latch: Grasps breast easily, tongue down, lips flanged, rhythmical sucking.  Audible Swallowing: A few with stimulation  Type of Nipple: Everted at rest and after stimulation  Comfort (Breast/Nipple): Soft / non-tender  Hold (Positioning): No assistance needed to correctly position infant at breast.  LATCH Score: 9   Lactation Tools Discussed/Used    Interventions    Discharge    Consult Status      Vanessa Thornton 12/29/2021, 2:21 PM

## 2021-12-29 NOTE — Progress Notes (Signed)
Labor Progress Note  S/O: Pt comfortable with epidural. Reports some intermittent pressure. Husband at bedside, supportive  Vitals:   12/29/21 0801 12/29/21 0830  BP: 113/70   Pulse: 97   Resp:    Temp:  99.9 F (37.7 C)  SpO2:      SVE: 8-9/90/0   EFM: cat I baseline 130 bpm mod var +accels, -decels Toco: ctxs q 5-6 min  A/P: 26Y G2P1001 @ 36.3 PPROM/labor  -PPROM with unclear rupture time: labor initially progressing well on own, ctxs now spaced and no cervical change over past 3 hours. Patient agrees to starting Pitocin: 2x2 per protocol ordered. Temperature 99.73F recheck in 1 hour, normal WBC on admission last night 7.9, no fetal tachycardia. Continue to monitor closely -GBS POS, cont Vancomycin coverage d/t PCN allergy and hx Clinda resistance -Cont EFM/Toco: cat I -Epidural labor pain mgmt -Hx PPH: proactive third stage mgmt -LGA, pelvis proven to 8#2, shoulder precautions -Routine intrapartum care -Anticipate NSVD  Vanessa Thornton 12/29/21 8:42 AM

## 2021-12-29 NOTE — Anesthesia Procedure Notes (Signed)
Epidural Patient location during procedure: OB Start time: 12/29/2021 1:35 AM End time: 12/29/2021 1:55 AM  Staffing Anesthesiologist: Val Eagle, MD Performed: anesthesiologist   Preanesthetic Checklist Completed: patient identified, IV checked, risks and benefits discussed, monitors and equipment checked, pre-op evaluation and timeout performed  Epidural Patient position: sitting Prep: DuraPrep Patient monitoring: heart rate, continuous pulse ox and blood pressure Approach: midline Location: L4-L5 Injection technique: LOR saline  Needle:  Needle type: Tuohy  Needle gauge: 17 G Needle length: 9 cm Needle insertion depth: 8 cm Catheter type: closed end flexible Catheter size: 19 Gauge Catheter at skin depth: 14 cm Test dose: negative and 2% lidocaine with Epi 1:200 K  Assessment Events: blood not aspirated, injection not painful, no injection resistance, no paresthesia and negative IV test

## 2021-12-29 NOTE — Anesthesia Preprocedure Evaluation (Signed)
Anesthesia Evaluation  Patient identified by MRN, date of birth, ID band Patient awake    Reviewed: Allergy & Precautions, Patient's Chart, lab work & pertinent test results  History of Anesthesia Complications Negative for: history of anesthetic complications  Airway Mallampati: III       Dental   Pulmonary neg pulmonary ROS, former smoker   breath sounds clear to auscultation       Cardiovascular negative cardio ROS  Rhythm:Regular     Neuro/Psych  PSYCHIATRIC DISORDERS Anxiety Depression    negative neurological ROS     GI/Hepatic negative GI ROS, Neg liver ROS,,,  Endo/Other  negative endocrine ROS    Renal/GU negative Renal ROS     Musculoskeletal   Abdominal   Peds  Hematology negative hematology ROS (+) Lab Results      Component                Value               Date                      WBC                      7.9                 12/28/2021                HGB                      12.3                12/28/2021                HCT                      38.9                12/28/2021                MCV                      85.5                12/28/2021                PLT                      206                 12/28/2021              Anesthesia Other Findings PCN allergy (hives)   Reproductive/Obstetrics (+) Pregnancy                             Anesthesia Physical Anesthesia Plan  ASA: 2  Anesthesia Plan: Epidural   Post-op Pain Management: Epidural*   Induction:   PONV Risk Score and Plan: 2 and Treatment may vary due to age or medical condition  Airway Management Planned: Natural Airway  Additional Equipment: None  Intra-op Plan:   Post-operative Plan:   Informed Consent: I have reviewed the patients History and Physical, chart, labs and discussed the procedure including the risks, benefits and alternatives for the proposed anesthesia with the patient or  authorized representative who has indicated his/her understanding and  acceptance.       Plan Discussed with:   Anesthesia Plan Comments:        Anesthesia Quick Evaluation

## 2021-12-29 NOTE — Lactation Note (Signed)
This note was copied from a baby's chart. Lactation Consultation Note  Patient Name: Vanessa Thornton [id number] Date: 12/29/2021 Reason for consult: Initial assessment;Late-preterm 34-36.6wks;Breastfeeding assistance Age:26 years  LC entered the room and the birth parent was just starting to breastfeed the infant.  Per the birth parent, he has been feeding well.  The RN stated that he is a LPTI and she set the birth parent up with the DEBP.  The birth parent said that she remembers how to hand express.  She is currently breastfeeding her 26 month old once per day.  LC reminded her to be sure to put the infant to the breast first prior to feeding her toddler.  The birth parent recently pumped and pumped 55mL.  The birth parent stated that she had no questions or concerns.   Infant Feeding Plan:  Breastfeed 8+ times in 24 hours according to feeding cues.  Put the infant to the breast prior to supplementing. Supplement according to supplementation guidelines.  Call RN/LC for assistance with breastfeeding.    Maternal Data Has patient been taught Hand Expression?: Yes Does the patient have breastfeeding experience prior to this delivery?: Yes How long did the patient breastfeed?: She has been breastfeeding her 26 month old and still breastfeeds once per day  Feeding Mother's Current Feeding Choice: Breast Milk  LATCH Score Latch: Grasps breast easily, tongue down, lips flanged, rhythmical sucking.  Audible Swallowing: A few with stimulation  Type of Nipple: Everted at rest and after stimulation  Comfort (Breast/Nipple): Soft / non-tender  Hold (Positioning): No assistance needed to correctly position infant at breast.  LATCH Score: 9   Lactation Tools Discussed/Used Tools: Pump Breast pump type: Double-Electric Breast Pump Reason for Pumping: Set up by the RN. Late preterm infant. Pumping frequency: q3hrs/after feedings Pumped volume: 30  mL  Interventions Interventions: LC Services brochure  Discharge Pump: DEBP;Personal  Consult Status Consult Status: Follow-up Date: 12/30/21 Follow-up type: In-patient    Orvil Feil Lilleigh Hechavarria 12/29/2021, 3:08 PM

## 2021-12-30 LAB — CBC
HCT: 30.7 % — ABNORMAL LOW (ref 36.0–46.0)
Hemoglobin: 9.7 g/dL — ABNORMAL LOW (ref 12.0–15.0)
MCH: 27.1 pg (ref 26.0–34.0)
MCHC: 31.6 g/dL (ref 30.0–36.0)
MCV: 85.8 fL (ref 80.0–100.0)
Platelets: 169 10*3/uL (ref 150–400)
RBC: 3.58 MIL/uL — ABNORMAL LOW (ref 3.87–5.11)
RDW: 16 % — ABNORMAL HIGH (ref 11.5–15.5)
WBC: 6.6 10*3/uL (ref 4.0–10.5)
nRBC: 0 % (ref 0.0–0.2)

## 2021-12-30 NOTE — Progress Notes (Signed)
Post Partum Day One NSVD at 36.3 PPROM Subjective: Patient doing well this morning no complaints. Reports VB has lightened, no clots. Up and ambulating without dizziness. Some uterine cramping, manageable with medication. Spontaneously voiding without difficulties. Tolerating regular diet, no N/V. No HA, vision changes, CP, or SOB.   Baby boy doing well at bedside, working on BF  Objective: Patient Vitals for the past 24 hrs:  BP Temp Temp src Pulse Resp SpO2  12/30/21 0513 (!) 101/55 98.8 F (37.1 C) Oral 69 16 --  12/30/21 0113 114/72 98.8 F (37.1 C) Oral 70 16 --  12/29/21 2052 121/66 99.1 F (37.3 C) Oral 79 16 100 %  12/29/21 1710 129/70 99.1 F (37.3 C) Oral 72 16 100 %  12/29/21 1308 124/67 99.7 F (37.6 C) Axillary 86 16 98 %  12/29/21 1208 110/66 98.9 F (37.2 C) Oral 90 16 99 %  12/29/21 1153 125/68 -- -- 97 -- --  12/29/21 1149 132/64 -- -- (!) 104 -- --    Physical Exam:  General: alert and no distress Lochia: appropriate Uterine Fundus: firm DVT Evaluation: No evidence of DVT seen on physical exam.  Recent Labs    12/28/21 2130 12/29/21 1031 12/30/21 0603  WBC 7.9 8.5 6.6  HGB 12.3 10.6* 9.7*  HCT 38.9 32.5* 30.7*  PLT 206 165 169    Recent Labs    12/28/21 2130  NA 138  K 3.8  CL 108  BUN <5*  CREATININE 0.57  GLUCOSE 79  BILITOT 0.4  ALT 16  AST 24  ALKPHOS 143*  PROT 7.0  ALBUMIN 2.7*    Recent Labs    12/28/21 2130  CALCIUM 8.9    No results for input(s): "PROTIME", "APTT", "INR" in the last 72 hours.  No results for input(s): "PROTIME", "APTT", "INR", "FIBRINOGEN" in the last 72 hours. Assessment/Plan: Plan for discharge tomorrow  Vanessa Thornton 26 y.o. [id number] PPD#1 sp NSVD at 36.3 PPROM 1. PPC: cont routine PP care pain regimen, regular diet, enc ambulation 2. Acute blood loss anemia: stable Hgb PPD1 and asymptomatic, plan to resume PO Fe at discharge 3. gHTN antepartum by labile BP's: normal PIH labs on admission and  normal BP PP, no meds, cont to monitor 4. Anxiety: cont home Celexa  5. RH Pos, Rubella Imm 6. Lactation consult PRN 7. Per Nursery, plan for neonatal circumcision tomorrow  Plan discharge home tomorrow PPD2 waiting neonatal discharge   LOS: 2 days   Sayeed Weatherall A Loisann Roach 12/30/2021, 11:35 AM

## 2021-12-30 NOTE — Anesthesia Postprocedure Evaluation (Signed)
Anesthesia Post Note  Patient: Vanessa Thornton  Procedure(s) Performed: AN AD HOC LABOR EPIDURAL     Patient location during evaluation: Mother Baby Anesthesia Type: Epidural Level of consciousness: awake, oriented and awake and alert Pain management: pain level controlled Vital Signs Assessment: post-procedure vital signs reviewed and stable Respiratory status: spontaneous breathing, respiratory function stable and nonlabored ventilation Cardiovascular status: stable Postop Assessment: no headache, adequate PO intake, able to ambulate, patient able to bend at knees and no apparent nausea or vomiting Anesthetic complications: no   No notable events documented.  Last Vitals:  Vitals:   12/30/21 0113 12/30/21 0513  BP: 114/72 (!) 101/55  Pulse: 70 69  Resp: 16 16  Temp: 37.1 C 37.1 C  SpO2:      Last Pain:  Vitals:   12/30/21 0758  TempSrc:   PainSc: 0-No pain   Pain Goal:                   Jayveon Convey

## 2021-12-30 NOTE — Lactation Note (Addendum)
This note was copied from a baby's chart. Lactation Consultation Note  Patient Name: Vanessa Thornton [id number] Date: 12/30/2021 Reason for consult: Mother's request;Late-preterm 34-36.6wks Age:26 hours  LC in to room per parents request. LC assisted with latch and noted heart shaped tongue. Parents shared that older sibling had a tongue and lip tie.  Reviewed LPTI behavior, feeding patterns and expectations with parents. Reinforced LPI/LBW crib card. Encouraged feeding volume as recommended. Birth parent has been pumping and feeding collected volume. Provided feeding cup and demonstrated use.    Plan:   1-Feeding on demand, ensuring a deep, comfortable latch.  2-Preserve infant energy limiting feeding sessions to 30 min max.  3-Hand pump or DEBP for supplementation purposes every 3h. 4-Encouraged birthing parent hydration, nutrition and rest.   Contact LC as needed for feeds/support/concerns/questions. All questions answered at this time.   Maternal Data Has patient been taught Hand Expression?: Yes Does the patient have breastfeeding experience prior to this delivery?: Yes  Feeding Mother's Current Feeding Choice: Breast Milk and Donor Milk  LATCH Score Latch: Grasps breast easily, tongue down, lips flanged, rhythmical sucking.  Audible Swallowing: A few with stimulation  Type of Nipple: Everted at rest and after stimulation  Comfort (Breast/Nipple): Soft / non-tender  Hold (Positioning): Assistance needed to correctly position infant at breast and maintain latch.  LATCH Score: 8   Lactation Tools Discussed/Used Tools: Pump;Flanges;Feeding cup;Coconut oil Flange Size: 24 Breast pump type: Double-Electric Breast Pump;Manual Pump Education: Setup, frequency, and cleaning;Milk Storage Reason for Pumping: LPTI Pumping frequency: every 3 hours  Interventions Interventions: Breast feeding basics reviewed;Assisted with latch;Skin to skin;Breast massage;Hand express;Breast  compression;Hand pump;Expressed milk;Support pillows;DEBP;Education;LPT handout/interventions  Discharge Discharge Education: Engorgement and breast care Pump: Personal;Manual;DEBP  Consult Status Consult Status: Follow-up Date: 12/31/21 Follow-up type: In-patient    Gilda Abboud A Higuera Ancidey 12/30/2021, 4:49 PM

## 2021-12-30 NOTE — Progress Notes (Signed)
MOB was referred for history of depression/anxiety. * Referral screened out by Clinical Social Worker because none of the following criteria appear to apply: ~ History of anxiety/depression during this pregnancy, or of post-partum depression following prior delivery. ~ Diagnosis of anxiety and/or depression within last 3 years OR * MOB's symptoms currently being treated with medication and/or therapy. MOB has an active prescription for citalopram 30mg  daily.  Please contact the Clinical Social Worker if needs arise, by [facility] request, or if MOB scores greater than 9/yes to question 10 on [place] Postpartum Depression Screen.  Signed,  04-05-1977, MSW, LCSWA, LCASA 12/30/2021 9:07 AM

## 2021-12-31 ENCOUNTER — Ambulatory Visit: Payer: Self-pay

## 2021-12-31 DIAGNOSIS — O9902 Anemia complicating childbirth: Secondary | ICD-10-CM | POA: Diagnosis not present

## 2021-12-31 MED ORDER — ACETAMINOPHEN 325 MG PO TABS: 650.0000 mg | ORAL_TABLET | ORAL | 1 refills | Status: AC | PRN

## 2021-12-31 MED ORDER — CITALOPRAM HYDROBROMIDE 10 MG PO TABS: 30.0000 mg | ORAL_TABLET | Freq: Every day | ORAL | 6 refills | Status: AC

## 2021-12-31 MED ORDER — IBUPROFEN 600 MG PO TABS: 600.0000 mg | ORAL_TABLET | Freq: Four times a day (QID) | ORAL | 0 refills | Status: AC

## 2021-12-31 MED ORDER — POLYSACCHARIDE IRON COMPLEX 150 MG PO CAPS: 150.0000 mg | ORAL_CAPSULE | Freq: Every day | ORAL | 1 refills | Status: AC

## 2021-12-31 NOTE — Discharge Summary (Signed)
OB Discharge Summary  Patient Name: Vanessa Thornton DOB: 07/30/1995 MRN: [id number]  Date of admission: 12/28/2021 Delivering provider: LAW, Elonda Husky A   Admitting diagnosis: Preterm premature rupture of membranes [O42.919] Intrauterine pregnancy: [redacted]w[redacted]d     Secondary diagnosis: Patient Active Problem List   Diagnosis Date Noted   SVD (spontaneous vaginal delivery) 12/31/2021   Second degree perineal laceration 12/31/2021   Postpartum care following vaginal delivery 12/2 12/31/2021   Maternal anemia, with delivery 12/31/2021   Preterm premature rupture of membranes 12/28/2021   Anxiety    Depression    Date of discharge: 12/31/2021   Discharge diagnosis: Principal Problem:   Postpartum care following vaginal delivery 12/2 Active Problems:   Anxiety   Depression   Preterm premature rupture of membranes   SVD (spontaneous vaginal delivery)   Second degree perineal laceration   Maternal anemia, with delivery                                                           Augmentation: Pitocin Pain control: Epidural  Laceration:2nd degree  Complications: ROM>24 hours  Hospital course:  Onset of Labor With Vaginal Delivery      26 y.o. yo [id number] at [redacted]w[redacted]d was admitted in Latent Labor on 12/28/2021. Labor course was complicated by PPROM.  Membrane Rupture Time/Date: 8:40 PM ,12/28/2021   Delivery Method:Vaginal, Spontaneous  Episiotomy: None  Lacerations:  2nd degree  Patient had a postpartum course complicated by anemia. She is ambulating, tolerating a regular diet, passing flatus, and urinating well. Patient is discharged home in stable condition on 12/31/21.  Newborn Data: Birth date:12/29/2021  Birth time:9:42 AM  Gender:Female  Living status:Living  Apgars:9 ,9  Weight:3600 g   Physical exam  Vitals:   12/30/21 0513 12/30/21 1518 12/30/21 2145 12/31/21 0525  BP: (!) 101/55 126/76 124/85 107/65  Pulse: 69 81 85 66  Resp: 16  16 16   Temp: 98.8 F (37.1 C) 98.4 F  (36.9 C) 98 F (36.7 C) 98.4 F (36.9 C)  TempSrc: Oral  Oral Oral  SpO2:  99% 99%   Weight:      Height:       General: alert, cooperative, and no distress Lochia: appropriate Uterine Fundus: firm Perineum: repair intact, no edema DVT Evaluation: No evidence of DVT seen on physical exam.  Labs: Lab Results  Component Value Date   WBC 6.6 12/30/2021   HGB 9.7 (L) 12/30/2021   HCT 30.7 (L) 12/30/2021   MCV 85.8 12/30/2021   PLT 169 12/30/2021      Latest Ref Rng & Units 12/28/2021    9:30 PM  CMP  Glucose 70 - 99 mg/dL 79   BUN 6 - 20 mg/dL <5   Creatinine 14/01/2021 - 1.00 mg/dL 6.64   Sodium 4.03 - 474 mmol/L 138   Potassium 3.5 - 5.1 mmol/L 3.8   Chloride 98 - 111 mmol/L 108   CO2 22 - 32 mmol/L 20   Calcium 8.9 - 10.3 mg/dL 8.9   Total Protein 6.5 - 8.1 g/dL 7.0   Total Bilirubin 0.3 - 1.2 mg/dL 0.4   Alkaline Phos 38 - 126 U/L 143   AST 15 - 41 U/L 24   ALT 0 - 44 U/L 16       12/29/2021   12:08  PM 06/07/2021   10:44 AM 10/17/2020    4:12 PM 08/29/2020    1:30 PM  Edinburgh Postnatal Depression Scale Screening Tool  I have been able to laugh and see the funny side of things. 0 0 0 0  I have looked forward with enjoyment to things. 0 0 0 0  I have blamed myself unnecessarily when things went wrong. 0 0 0 0  I have been anxious or worried for no good reason. 2 1 1  0  I have felt scared or panicky for no good reason. 0 0 0 3  Things have been getting on top of me. 0 0 1 0  I have been so unhappy that I have had difficulty sleeping. 0 0 0 0  I have felt sad or miserable. 0 0 0 0  I have been so unhappy that I have been crying. 0 0 0 0  The thought of harming myself has occurred to me. 0 0 0 0  Edinburgh Postnatal Depression Scale Total 2 1 2 3    Discharge instructions:  per After Visit Summary  After Visit Meds:  Allergies as of 12/31/2021       Reactions   Doxycycline    Other reaction(s): Other (See Comments) Bad vaginal yeast infection   Penicillins Hives,  Rash   Has patient had a PCN reaction causing immediate rash, facial/tongue/throat swelling, SOB or lightheadedness with hypotension: Yes Has patient had a PCN reaction causing severe rash involving mucus membranes or skin necrosis: No Has patient had a PCN reaction that required hospitalization No Has patient had a PCN reaction occurring within the last 10 years: Yes If all of the above answers are "NO", then may proceed with Cephalosporin use.        Medication List     STOP taking these medications    UNABLE TO FIND       TAKE these medications    acetaminophen 325 MG tablet Commonly known as: Tylenol Take 2 tablets (650 mg total) by mouth every 4 (four) hours as needed (for pain scale < 4).   citalopram 10 MG tablet Commonly known as: CELEXA Take 3 tablets (30 mg total) by mouth daily. What changed:  medication strength See the new instructions.   fluticasone 50 MCG/ACT nasal spray Commonly known as: FLONASE Place 1 spray into both nostrils daily.   ibuprofen 600 MG tablet Commonly known as: ADVIL Take 1 tablet (600 mg total) by mouth every 6 (six) hours.   iron polysaccharides 150 MG capsule Commonly known as: Ferrex 150 Take 1 capsule (150 mg total) by mouth daily.   multivitamin-prenatal 27-0.8 MG Tabs tablet Take 1 tablet by mouth daily at 12 noon.       Activity: Advance as tolerated. Pelvic rest for 6 weeks.   Newborn Data: Live born female  Birth Weight: 7 lb 15 oz (3600 g) APGAR: 9, 9  Newborn Delivery   Birth date/time: 12/29/2021 09:42:00 Delivery type: Vaginal, Spontaneous    Baby Feeding: Bottle and Breast Circumcision: Completed/Dr. 14/04/2021 Disposition:home with mother  Delivery Report:  Review the Delivery Report for details.    Follow up:  Follow-up Information     14/02/2021 Juliene Pina, MD. Schedule an appointment as soon as possible for a visit in 6 week(s).   Specialty: Obstetrics and Gynecology Contact information: [address] [place] [address] [place] [id number]                [id number], CNM, MSN  12/31/2021, 10:51 AM

## 2021-12-31 NOTE — Lactation Note (Signed)
This note was copied from a baby's chart. Lactation Consultation Note  Patient Name: Vanessa Thornton [id number] Date: 12/31/2021 Age: 26 hours   P2, LPTI female ,-3% weight loss, infant is:  breast feed and supplementing with donor breast milk. Birth Parent feels breastfeeding is going well : no questions or concerns for LC at this time.  Per Birth Parent infant recently BF for 21 minutes and afterwards was given 5 mls EBM and 30 mls of donor breast milk with current feeding at 1523 pm. LC did not observe latch infant was sleeping in basinet while LC was in the room. LC reviewed LPTI feeding guidelines, Birth Parent is limiting total feedings at breast and with supplementation EBM/Donor to total of 30 minutes or less, feeding infant 8x within 24 hours,  Birth Parent is pumping every 3 hours for 15 minutes.  Maternal Data    Feeding    LATCH Score                    Lactation Tools Discussed/Used    Interventions    Discharge    Consult Status      Frederico Hamman 12/31/2021, 4:48 PM

## 2021-12-31 NOTE — Progress Notes (Addendum)
Post Partum Day #2 NSVD at 36.3 PPROM Subjective: Patient doing well this morning no complaints. Reports VB has lightened, no clots. Up and ambulating without dizziness. Some uterine cramping, manageable with medication. Spontaneously voiding without difficulties. Tolerating regular diet, no N/V. No HA, vision changes, CP, or SOB.   Baby boy doing well at bedside, working on BF  Objective: Patient Vitals for the past 24 hrs:  BP Temp Temp src Pulse Resp SpO2  12/31/21 0525 107/65 98.4 F (36.9 C) Oral 66 16 --  12/30/21 2145 124/85 98 F (36.7 C) Oral 85 16 99 %  12/30/21 1518 126/76 98.4 F (36.9 C) -- 81 -- 99 %   Physical Exam:  General: alert and no distress Lochia: appropriate Uterine Fundus: firm DVT Evaluation: No evidence of DVT seen on physical exam.  Recent Labs    12/28/21 2130 12/29/21 1031 12/30/21 0603  WBC 7.9 8.5 6.6  HGB 12.3 10.6* 9.7*  HCT 38.9 32.5* 30.7*  PLT 206 165 169    Recent Labs    12/28/21 2130  NA 138  K 3.8  CL 108  BUN <5*  CREATININE 0.57  GLUCOSE 79  BILITOT 0.4  ALT 16  AST 24  ALKPHOS 143*  PROT 7.0  ALBUMIN 2.7*    Assessment/Plan:  Vanessa Thornton 26 y.o. [id number] PPD#2 sp Pretern NSVD at 36.3 PPROM 1. PPC: cont routine PP care pain regimen, regular diet, enc ambulation 2. Acute blood loss anemia: stable Hgb PPD1 and asymptomatic, plan to resume PO Fe at discharge 3. gHTN antepartum by labile BP's: normal PIH labs on admission and normal BP PP, no meds, cont to monitor- BPs WNL 4. Anxiety: cont home Celexa  5. RH Pos, Rubella Imm 6. Lactation consult PRN  Circ now. Mom will stay as baby pt since he has high Bili  PP care and warning ss dw pt. F/up Dr Amado Nash 6 wks, BC options dw pt     LOS: 3 days   Robley Fries 12/31/2021, 10:43 AM

## 2022-01-01 ENCOUNTER — Ambulatory Visit: Payer: Self-pay

## 2022-01-01 LAB — SURGICAL PATHOLOGY

## 2022-01-01 NOTE — Lactation Note (Signed)
This note was copied from a baby's chart. Lactation Consultation Note  Patient Name: Vanessa Thornton [id number] Date: 01/01/2022 Reason for consult: Follow-up assessment;Late-preterm 34-36.6wks Age:26 hours   P2: Late preterm infant at 36+3 weeks (CGA of 36+6 weeks) Feeding preference: Breast/donor breast milk Weight loss:  3%  "Vanessa Thornton" was swaddled and asleep in father's arms when I arrived.  Mother reported that breast feeding has been going well; no questions/concerns.  Baby is voiding/stooling well.  Encouraged to continue feeding on cue or at least every three hours.  Mother is pumping an average of 8-10 mls/three hours and feeds her expressed milk prior to giving any donor milk supplementation.  Suggested "Clydene Pugh" consume 30+ mls with every feeding now.  Parents have no difficulty with bottle feeding.  Family will call for assistance as needed.   Maternal Data    Feeding Mother's Current Feeding Choice: Breast Milk and Donor Milk  LATCH Score                    Lactation Tools Discussed/Used Tools: Pump;Flanges Flange Size: 24 Breast pump type: Double-Electric Breast Pump Pump Education: Setup, frequency, and cleaning (No review needed) Reason for Pumping: Breast stimulation for supplementation: LPTI Pumping frequency: Every three hours Pumped volume: 10 mL  Interventions    Discharge Pump: Personal  Consult Status Consult Status: Follow-up Date: 01/02/22 Follow-up type: In-patient    Wilmoth Rasnic R Jaksen Fiorella 01/01/2022, 3:07 PM

## 2022-01-02 ENCOUNTER — Inpatient Hospital Stay (HOSPITAL_COMMUNITY): Payer: BC Managed Care – PPO

## 2022-01-02 ENCOUNTER — Inpatient Hospital Stay (HOSPITAL_COMMUNITY)
Admission: AD | Admit: 2022-01-02 | Payer: BC Managed Care – PPO | Source: Home / Self Care | Admitting: Obstetrics and Gynecology

## 2022-01-02 ENCOUNTER — Ambulatory Visit: Payer: Self-pay

## 2022-01-02 NOTE — Lactation Note (Signed)
This note was copied from a baby's chart. Lactation Consultation Note  Patient Name: Vanessa Thornton [id number]'[id number] Date: 01/02/2022 Reason for consult: Follow-up assessment;Late-preterm 34-36.6wks Age:26 days   P2: Late preterm infant at 36+3 weeks with a CGA of 37+0 weeks Feeding preference: Breast/donor breast milk Weight loss: 4%  "Vanessa Thornton" started on double phototherapy today: 1 bank light and 1 bili blanket.  Bilirubin level was 16.3 mg/dl at 97 hours of life.    SLP consult completed this afternoon.  Parents report that "Vanessa Thornton" is now using the gold nipple and father remarked that they have learned some helpful tips from the SLP.  Since it was a feeding time I offered to assist with latching; mother receptive.  Attempted to latch in the football position, however, baby was not at all interested; very sleepy at this time.  Gentle stimulation demonstrated but he did not awaken.  Informed parents that the phototherapy can also make babies more sleepy.  After 5 minutes of trying to latch, I suggested father prepare the mother's milk for feeding.  Mother has been able to pump volumes from 8-20 mls/pumping session.  Encouraged to continue attempting breast feeding for no longer than 5 minutes.  If "Vanessa Thornton" is sleepy and not interested allow him to supplement.  Parents will use mother's milk before giving donor milk; goal of 30+ mls/feeding session.  Mother has no questions regarding pumping.  Offered to return tomorrow to check on their progress.  Parents have no questions/concerns at this time.    Parents work well together and father is very helpful and supportive.     Maternal Data    Feeding Mother's Current Feeding Choice: Breast Milk and Donor Milk  LATCH Score Latch: Too sleepy or reluctant, no latch achieved, no sucking elicited.  Audible Swallowing: None  Type of Nipple: Everted at rest and after stimulation  Comfort (Breast/Nipple): Soft / non-tender  Hold (Positioning):  Assistance needed to correctly position infant at breast and maintain latch.  LATCH Score: 5   Lactation Tools Discussed/Used    Interventions Interventions: Breast feeding basics reviewed;Assisted with latch;Skin to skin;Breast massage;Hand express;Position options;Adjust position;Education  Discharge Pump: Personal  Consult Status Consult Status: Follow-up Date: 01/03/22 Follow-up type: In-patient    Dora Sims 01/02/2022, 6:48 PM

## 2022-01-03 ENCOUNTER — Ambulatory Visit: Payer: Self-pay

## 2022-01-03 NOTE — Lactation Note (Signed)
This note was copied from a baby's chart. Lactation Consultation Note  Patient Name: Vanessa Thornton Date: 01/03/2022 Reason for consult: Follow-up assessment;Late-preterm 34-36.6wks Age:26 days   P2: Late preterm infant at 36+3 weeks with a CGA of 37+1 weeks Feeding preference: Breast/donor milk Weight loss: 5% Phototherapy: discontinued this morning: Bilirubin level is 11.5 mg/dl at 333 hours of life  Reviewed feeding plan from yesterday's visit throughout the night last night.  Parents have been diligent with following the plan established.  Mother continues to latch "Vanessa Thornton" and follow with donor breast milk supplementation.  He is averaging 30 mls/feeding.  Total feeding time between breast and bottle has been < 30 minutes.  "Vanessa Thornton" continues to use the gold nipple with ease.  Discussed how to progress after discharge.  Parents have a few other nipples they will take home as he progresses.  Mother has been pumping between 20-30 mls every three hours.  Parents are feeding mother's expressed milk prior to giving donor breast milk.  They are aware to feed 30+ mls now and to allow "Vanessa Thornton" to consume the volumes he desires; burping well every 10-15 mls and at the end of the feedings.  Parents have worked hard on following the feeding plan for "Vanessa Thornton."  Father is a good support for mother.  Allowed time for questions.  Follow up pediatrician's visit will be tomorrow.  Parents hoping for a discharge today.  Spoke with NP regarding this baby.   Maternal Data    Feeding Mother's Current Feeding Choice: Breast Milk and Donor Milk  LATCH Score                    Lactation Tools Discussed/Used    Interventions    Discharge    Consult Status Consult Status: Follow-up Date: 01/03/22 Follow-up type: In-patient    Vanessa Thornton 01/03/2022, 11:43 AM

## 2022-01-08 ENCOUNTER — Telehealth (HOSPITAL_COMMUNITY): Payer: Self-pay | Admitting: *Deleted

## 2022-01-08 NOTE — Telephone Encounter (Signed)
Mom reports feeling good. No concerns about herself at this time. EPDS=1 [facility] score=2) Mom reports baby is doing well. Feeding, peeing, and pooping without difficulty. Safe sleep reviewed. Mom reports no concerns about baby at present.  Duffy Rhody, RN 01-08-2022 at 10:30am

## 2022-01-09 ENCOUNTER — Telehealth: Payer: BC Managed Care – PPO | Admitting: Emergency Medicine

## 2022-01-09 DIAGNOSIS — H9209 Otalgia, unspecified ear: Secondary | ICD-10-CM

## 2022-01-09 MED ORDER — AZITHROMYCIN 250 MG PO TABS: ORAL_TABLET | ORAL | 0 refills | Status: AC

## 2022-01-09 NOTE — Progress Notes (Signed)
E-Visit for Sinus Problems  It's possible your sinus symptoms are turning into an ear infection.  Due to your medication allergies, I'll start you on a z-pak to hopefully treat your ear pain.    If symptoms worsen, follow-up with your doctor.    Some authorities believe that zinc sprays or the use of Echinacea may shorten the course of your symptoms.  Sinus infections are not as easily transmitted as other respiratory infection, however we still recommend that you avoid close contact with loved ones, especially the very young and elderly.  Remember to wash your hands thoroughly throughout the day as this is the number one way to prevent the spread of infection!  Home Care: Only take medications as instructed by your medical team. Do not take these medications with alcohol. A steam or ultrasonic humidifier can help congestion.  You can place a towel over your head and breathe in the steam from hot water coming from a faucet. Avoid close contacts especially the very young and the elderly. Cover your mouth when you cough or sneeze. Always remember to wash your hands.  Get Help Right Away If: You develop worsening fever or sinus pain. You develop a severe head ache or visual changes. Your symptoms persist after you have completed your treatment plan.  Make sure you Understand these instructions. Will watch your condition. Will get help right away if you are not doing well or get worse.   Thank you for choosing an e-visit.  Your e-visit answers were reviewed by a board certified advanced clinical practitioner to complete your personal care plan. Depending upon the condition, your plan could have included both over the counter or prescription medications.  Please review your pharmacy choice. Make sure the pharmacy is open so you can pick up prescription now. If there is a problem, you may contact your provider through [employer] and have the prescription routed to another  pharmacy.  Your safety is important to [place]. If you have drug allergies check your prescription carefully.   For the next 24 hours you can use MyChart to ask questions about today's visit, request a non-urgent call back, or ask for a work or school excuse. You will get an email in the next two days asking about your experience. I hope that your e-visit has been valuable and will speed your recovery.  Approximately 5 minutes was used in reviewing the patient's chart, questionnaire, prescribing medications, and documentation.

## 2022-01-17 DIAGNOSIS — M9904 Segmental and somatic dysfunction of sacral region: Secondary | ICD-10-CM | POA: Diagnosis not present

## 2022-01-17 DIAGNOSIS — M9903 Segmental and somatic dysfunction of lumbar region: Secondary | ICD-10-CM | POA: Diagnosis not present

## 2022-01-17 DIAGNOSIS — M9902 Segmental and somatic dysfunction of thoracic region: Secondary | ICD-10-CM | POA: Diagnosis not present

## 2022-01-17 DIAGNOSIS — M9901 Segmental and somatic dysfunction of cervical region: Secondary | ICD-10-CM | POA: Diagnosis not present

## 2022-01-23 ENCOUNTER — Inpatient Hospital Stay (HOSPITAL_COMMUNITY)
Admission: AD | Admit: 2022-01-23 | Payer: BC Managed Care – PPO | Source: Home / Self Care | Admitting: Obstetrics and Gynecology

## 2022-06-30 ENCOUNTER — Ambulatory Visit: Payer: BC Managed Care – PPO

## 2023-01-20 ENCOUNTER — Ambulatory Visit
Admission: RE | Admit: 2023-01-20 | Discharge: 2023-01-20 | Disposition: A | Discharge status: Home / Self Care | Payer: BC Managed Care – PPO | Source: Ambulatory Visit | Attending: Physician Assistant | Admitting: Physician Assistant

## 2023-01-20 ENCOUNTER — Other Ambulatory Visit: Payer: Self-pay | Admitting: Physician Assistant

## 2023-01-20 DIAGNOSIS — M542 Cervicalgia: Secondary | ICD-10-CM

## 2023-01-20 DIAGNOSIS — R202 Paresthesia of skin: Secondary | ICD-10-CM

## 2023-01-20 MED ORDER — GADOBUTROL 1 MMOL/ML IV SOLN
9.0000 mL | Freq: Once | INTRAVENOUS | Status: AC | PRN
  Administered 2023-01-20: 9 mL via INTRAVENOUS

## 2023-03-22 ENCOUNTER — Other Ambulatory Visit: Payer: Self-pay

## 2023-03-22 ENCOUNTER — Emergency Department
Admission: EM | Admit: 2023-03-22 | Discharge: 2023-03-23 | Disposition: A | Discharge status: Home / Self Care | Payer: BC Managed Care – PPO | Attending: Emergency Medicine | Admitting: Emergency Medicine

## 2023-03-22 ENCOUNTER — Emergency Department: Payer: BC Managed Care – PPO

## 2023-03-22 ENCOUNTER — Encounter: Payer: Self-pay | Admitting: Emergency Medicine

## 2023-03-22 DIAGNOSIS — R202 Paresthesia of skin: Secondary | ICD-10-CM | POA: Diagnosis present

## 2023-03-22 LAB — CBC WITH DIFFERENTIAL/PLATELET
Abs Immature Granulocytes: 0.04 10*3/uL (ref 0.00–0.07)
Basophils Absolute: 0 10*3/uL (ref 0.0–0.1)
Basophils Relative: 0 %
Eosinophils Absolute: 0.1 10*3/uL (ref 0.0–0.5)
Eosinophils Relative: 1 %
HCT: 41.8 % (ref 36.0–46.0)
Hemoglobin: 13.3 g/dL (ref 12.0–15.0)
Immature Granulocytes: 0 %
Lymphocytes Relative: 25 %
Lymphs Abs: 2.5 10*3/uL (ref 0.7–4.0)
MCH: 27.8 pg (ref 26.0–34.0)
MCHC: 31.8 g/dL (ref 30.0–36.0)
MCV: 87.4 fL (ref 80.0–100.0)
Monocytes Absolute: 0.4 10*3/uL (ref 0.1–1.0)
Monocytes Relative: 4 %
Neutro Abs: 6.8 10*3/uL (ref 1.7–7.7)
Neutrophils Relative %: 70 %
Platelets: 349 10*3/uL (ref 150–400)
RBC: 4.78 MIL/uL (ref 3.87–5.11)
RDW: 13.6 % (ref 11.5–15.5)
WBC: 9.9 10*3/uL (ref 4.0–10.5)
nRBC: 0 % (ref 0.0–0.2)

## 2023-03-22 LAB — BASIC METABOLIC PANEL
Anion gap: 12 (ref 5–15)
BUN: 14 mg/dL (ref 6–20)
CO2: 24 mmol/L (ref 22–32)
Calcium: 9 mg/dL (ref 8.9–10.3)
Chloride: 101 mmol/L (ref 98–111)
Creatinine, Ser: 0.61 mg/dL (ref 0.44–1.00)
GFR, Estimated: >60 mL/min (ref >60)
Glucose, Bld: 87 mg/dL (ref 70–99)
Potassium: 4 mmol/L (ref 3.5–5.1)
Sodium: 137 mmol/L (ref 135–145)

## 2023-03-22 LAB — URINALYSIS, ROUTINE W REFLEX MICROSCOPIC
Bilirubin Urine: NEGATIVE
Glucose, UA: NEGATIVE mg/dL
Hgb urine dipstick: NEGATIVE
Ketones, ur: NEGATIVE mg/dL
Leukocytes,Ua: NEGATIVE
Nitrite: NEGATIVE
Protein, ur: NEGATIVE mg/dL
Specific Gravity, Urine: 1.031 — ABNORMAL HIGH (ref 1.005–1.030)
pH: 5 (ref 5.0–8.0)

## 2023-03-22 LAB — PREGNANCY, URINE: Preg Test, Ur: NEGATIVE

## 2023-03-22 LAB — CBG MONITORING, ED: Glucose-Capillary: 78 mg/dL (ref 70–99)

## 2023-03-22 NOTE — ED Provider Notes (Signed)
 [facility] Provider Note    Event Date/Time   First MD Initiated Contact with Patient 03/22/23 2303     (approximate)   History   Numbness   HPI  Vanessa Thornton is a 28 y.o. female with history of depression, anxiety who presents to the emergency department with complaints of numbness in the left side of her face.  She states that it started this morning and feels like a tightness in her cheek.  She states it feels like her skin is dry when it is not.  She has had a headache recently but none today.  That headache resolved with ibuprofen.  No head injury.  She was seen by her PCP for numbness in the left fourth and fifth digit, elbow and left great toe.  She had an MRI of her brain with and without contrast and an MRI of her cervical spine without contrast which were unremarkable.  She has not seen neurology.  No history of MS.  No fevers.  No weakness.  No speech or vision changes.  She states she was told that her vitamin B12 level was low and she is taking oral replacement.   History provided by patient, husband.    Past Medical History:  Diagnosis Date   [redacted] weeks gestation of pregnancy    Anxiety    Depression    PVC (premature ventricular contraction)     Past Surgical History:  Procedure Laterality Date   WISDOM TOOTH EXTRACTION      MEDICATIONS:  Prior to Admission medications   Medication Sig Start Date End Date Taking? Authorizing Provider  acetaminophen (TYLENOL) 325 MG tablet Take 2 tablets (650 mg total) by mouth every 4 (four) hours as needed (for pain scale < 4). 12/31/21   June Leap, CNM  azithromycin (ZITHROMAX) 250 MG tablet Take 2 tabs today, then take 1 tab daily until gone. 01/09/22   Roxy Horseman, PA-C  citalopram (CELEXA) 10 MG tablet Take 3 tablets (30 mg total) by mouth daily. 12/31/21   June Leap, CNM  fluticasone (FLONASE) 50 MCG/ACT nasal spray Place 1 spray into both nostrils daily.    [provider]  ibuprofen (ADVIL) 600 MG tablet Take 1 tablet (600 mg total) by mouth every 6 (six) hours. 12/31/21   June Leap, CNM  iron polysaccharides (FERREX 150) 150 MG capsule Take 1 capsule (150 mg total) by mouth daily. 12/31/21   June Leap, CNM  Prenatal Vit-Fe Fumarate-FA (MULTIVITAMIN-PRENATAL) 27-0.8 MG TABS tablet Take 1 tablet by mouth daily at 12 noon.    [provider]    Physical Exam   Triage Vital Signs: ED Triage Vitals  Encounter Vitals Group     BP 03/22/23 2011 (!) 142/89     Systolic BP Percentile --      Diastolic BP Percentile --      Pulse Rate 03/22/23 2011 76     Resp 03/22/23 2011 18     Temp 03/22/23 2011 98.9 F (37.2 C)     Temp Source 03/22/23 2011 Oral     SpO2 03/22/23 2011 99 %     Weight 03/22/23 2026 215 lb (97.5 kg)     Height 03/22/23 2026 5\' 6"  (1.676 m)     Head Circumference --      Peak Flow --      Pain Score 03/22/23 2025 0     Pain Loc --  Pain Education --      Exclude from Growth Chart --     Most recent vital signs: Vitals:   03/22/23 2300 03/23/23 0002  BP: 114/68 114/68  Pulse: 67   Resp: (!) 30 20  Temp: 98 F (36.7 C) 98 F (36.7 C)  SpO2: 98% 99%    CONSTITUTIONAL: Alert, responds appropriately to questions. Well-appearing; well-nourished HEAD: Normocephalic, atraumatic EYES: Conjunctivae clear, pupils appear equal, sclera nonicteric ENT: normal nose; moist mucous membranes NECK: Supple, normal ROM CARD: RRR; S1 and S2 appreciated RESP: Normal chest excursion without splinting or tachypnea; breath sounds clear and equal bilaterally; no wheezes, no rhonchi, no rales, no hypoxia or respiratory distress, speaking full sentences ABD/GI: Non-distended; soft, non-tender, no rebound, no guarding, no peritoneal signs BACK: The back appears normal EXT: Normal ROM in all joints; no deformity noted, no edema SKIN: Normal color for age and race; warm; no rash on exposed skin NEURO: Moves all  extremities equally, normal speech, strength 5/5 in all 4 extremities, cranial nerves II through XII intact, patient reports normal sensation diffusely PSYCH: The patient's mood and manner are appropriate.   ED Results / Procedures / Treatments   LABS: (all labs ordered are listed, but only abnormal results are displayed) Labs Reviewed  URINALYSIS, ROUTINE W REFLEX MICROSCOPIC - Abnormal; Notable for the following components:      Result Value   Color, Urine YELLOW (*)    APPearance HAZY (*)    Specific Gravity, Urine 1.031 (*)    All other components within normal limits  PREGNANCY, URINE  CBC WITH DIFFERENTIAL/PLATELET  BASIC METABOLIC PANEL  CBG MONITORING, ED     EKG:  EKG Interpretation Date/Time:  Saturday March 22 2023 20:17:38 EST Ventricular Rate:  75 PR Interval:  152 QRS Duration:  84 QT Interval:  376 QTC Calculation: 419 R Axis:   50  Text Interpretation: Sinus rhythm with occasional Premature ventricular complexes Otherwise normal ECG When compared with ECG of 17-Feb-2016 04:48, Premature ventricular complexes are now Present Confirmed by Rochele Raring [phone]) on 03/22/2023 11:23:09 PM         RADIOLOGY: My personal review and interpretation of imaging: CT head unremarkable.  I have personally reviewed all radiology reports.   CT Head Wo Contrast Result Date: 03/22/2023 CLINICAL DATA:  Left facial numbness EXAM: CT HEAD WITHOUT CONTRAST TECHNIQUE: Contiguous axial images were obtained from the base of the skull through the vertex without intravenous contrast. RADIATION DOSE REDUCTION: This exam was performed according to the departmental dose-optimization program which includes automated exposure control, adjustment of the mA and/or kV according to patient size and/or use of iterative reconstruction technique. COMPARISON:  None Available. FINDINGS: Brain: No mass,hemorrhage or extra-axial collection. Normal appearance of the parenchyma and CSF spaces.  Vascular: No hyperdense vessel or unexpected vascular calcification. Skull: The visualized skull base, calvarium and extracranial soft tissues are normal. Sinuses/Orbits: No fluid levels or advanced mucosal thickening of the visualized paranasal sinuses. No mastoid or middle ear effusion. Normal orbits. Other: None. IMPRESSION: Normal head CT. Electronically Signed   By: Deatra Robinson M.D.   On: 03/22/2023 21:08     PROCEDURES:  Critical Care performed: No     Procedures    IMPRESSION / MDM / ASSESSMENT AND PLAN / ED COURSE  I reviewed the triage vital signs and the nursing notes.    Patient here with abnormal feeling to the left face that she describes as a "tightness".  The patient is on the  cardiac monitor to evaluate for evidence of arrhythmia and/or significant heart rate changes.   DIFFERENTIAL DIAGNOSIS (includes but not limited to):   Low suspicion for stroke, intracranial hemorrhage, mass, MS especially given recent normal MRIs.  Symptoms could be related to low vitamin B12 level, thyroid dysfunction, electrolyte derangement.   Patient's presentation is most consistent with acute presentation with potential threat to life or bodily function.   PLAN: Workup initiated from triage.  Normal hemoglobin, electrolytes, glucose.  Urine shows no sign of infection, dehydration and she is not pregnant.  CT of the head is also unremarkable and reviewed and interpreted by myself and the radiologist.  She has a normal neuroexam here with an NIH stroke scale of 0.  I have reviewed her recent imaging at the end of December 2024.  Discussed with patient that I do not think that she is having a stroke as she has no risk factors for the same and is young, healthy.  We did discuss things such as MS causing symptoms but again seems less likely given recent normal MRI.  I did offer to repeat MRI of her brain today given these new symptoms in her face but patient and husband are comfortable with  holding off on further imaging in the setting of a normal neuroexam and recent normal imaging and following up with her PCP and neurology as an outpatient.  Discussed with patient and husband that there are likely many test that can be ordered outpatient for further workup for this that do not necessarily need to be done in the emergency department.  Patient and husband are comfortable with this plan.  Discussed tricked return precautions.   MEDICATIONS GIVEN IN ED: Medications - No data to display   ED COURSE:  At this time, I do not feel there is any life-threatening condition present. I reviewed all nursing notes, vitals, pertinent previous records.  All lab and urine results, EKGs, imaging ordered have been independently reviewed and interpreted by myself.  I reviewed all available radiology reports from any imaging ordered this visit.  Based on my assessment, I feel the patient is safe to be discharged home without further emergent workup and can continue workup as an outpatient as needed. Discussed all findings, treatment plan as well as usual and customary return precautions.  They verbalize understanding and are comfortable with this plan.  Outpatient follow-up has been provided as needed.  All questions have been answered.    CONSULTS:  none   OUTSIDE RECORDS REVIEWED: Reviewed recent PCP notes, MRIs.       FINAL CLINICAL IMPRESSION(S) / ED DIAGNOSES   Final diagnoses:  Paresthesia     Rx / DC Orders   ED Discharge Orders     None        Note:  This document was prepared using Dragon voice recognition software and may include unintentional dictation errors.   Zadie Deemer, Layla Maw, DO 03/23/23 [phone]

## 2023-03-22 NOTE — ED Triage Notes (Signed)
 Patient presents to ED from home for Left facial numbness starting two days ago, patient has a history of parasthesia in hands and feet, no slurring or drooping noted in triage.
